# Patient Record
Sex: Male | Born: 1940 | Race: White | Hispanic: No | Marital: Married | Smoking: Former smoker
Health system: Southern US, Community
[De-identification: ages and names within clinical notes are randomized; demographics above are authoritative.]

## PROBLEM LIST (undated history)

## (undated) DIAGNOSIS — Z973 Presence of spectacles and contact lenses: Secondary | ICD-10-CM

## (undated) DIAGNOSIS — C801 Malignant (primary) neoplasm, unspecified: Secondary | ICD-10-CM

## (undated) DIAGNOSIS — R011 Cardiac murmur, unspecified: Secondary | ICD-10-CM

## (undated) DIAGNOSIS — E119 Type 2 diabetes mellitus without complications: Secondary | ICD-10-CM

## (undated) DIAGNOSIS — N390 Urinary tract infection, site not specified: Secondary | ICD-10-CM

## (undated) DIAGNOSIS — G629 Polyneuropathy, unspecified: Secondary | ICD-10-CM

## (undated) DIAGNOSIS — M1990 Unspecified osteoarthritis, unspecified site: Secondary | ICD-10-CM

## (undated) DIAGNOSIS — R413 Other amnesia: Secondary | ICD-10-CM

## (undated) HISTORY — DX: Other amnesia: R41.3

## (undated) HISTORY — PX: COLONOSCOPY: SHX174

## (undated) HISTORY — PX: DENTAL RESTORATION/EXTRACTION WITH X-RAY: SHX5796

## (undated) HISTORY — DX: Urinary tract infection, site not specified: N39.0

## (undated) HISTORY — DX: Malignant (primary) neoplasm, unspecified: C80.1

---

## 1978-06-15 HISTORY — PX: SALIVARY GLAND SURGERY: SHX768

## 1993-06-15 HISTORY — PX: BACK SURGERY: SHX140

## 2010-06-15 HISTORY — PX: KNEE ARTHROSCOPY: SHX127

## 2012-08-30 ENCOUNTER — Other Ambulatory Visit: Payer: Self-pay | Admitting: Orthopedic Surgery

## 2012-08-30 ENCOUNTER — Encounter (HOSPITAL_BASED_OUTPATIENT_CLINIC_OR_DEPARTMENT_OTHER): Payer: Self-pay | Admitting: *Deleted

## 2012-08-30 NOTE — Progress Notes (Signed)
Pt had labs today-seeing pcp in [place] 09/01/12 for ekg and physical and surg clearance Called dr Mosetta [facility]-[phone] to get labs,ekg,notes by 09/02/12 Pt to bring all meds and overnight bag in case he has to stay-lives in [place]

## 2012-09-05 ENCOUNTER — Encounter (HOSPITAL_BASED_OUTPATIENT_CLINIC_OR_DEPARTMENT_OTHER)
Admission: RE | Disposition: A | Discharge status: Home / Self Care | Payer: Self-pay | Source: Ambulatory Visit | Attending: Orthopedic Surgery

## 2012-09-05 ENCOUNTER — Ambulatory Visit (HOSPITAL_BASED_OUTPATIENT_CLINIC_OR_DEPARTMENT_OTHER)
Admission: RE | Admit: 2012-09-05 | Discharge: 2012-09-05 | Disposition: A | Discharge status: Home / Self Care | Payer: Medicare Other | Source: Ambulatory Visit | Attending: Orthopedic Surgery | Admitting: Orthopedic Surgery

## 2012-09-05 ENCOUNTER — Encounter (HOSPITAL_BASED_OUTPATIENT_CLINIC_OR_DEPARTMENT_OTHER): Payer: Self-pay | Admitting: Certified Registered Nurse Anesthetist

## 2012-09-05 ENCOUNTER — Encounter (HOSPITAL_BASED_OUTPATIENT_CLINIC_OR_DEPARTMENT_OTHER): Payer: Self-pay | Admitting: *Deleted

## 2012-09-05 ENCOUNTER — Ambulatory Visit (HOSPITAL_BASED_OUTPATIENT_CLINIC_OR_DEPARTMENT_OTHER): Payer: Medicare Other | Admitting: Certified Registered Nurse Anesthetist

## 2012-09-05 DIAGNOSIS — M67919 Unspecified disorder of synovium and tendon, unspecified shoulder: Secondary | ICD-10-CM | POA: Insufficient documentation

## 2012-09-05 DIAGNOSIS — E1149 Type 2 diabetes mellitus with other diabetic neurological complication: Secondary | ICD-10-CM | POA: Insufficient documentation

## 2012-09-05 DIAGNOSIS — M19019 Primary osteoarthritis, unspecified shoulder: Secondary | ICD-10-CM | POA: Insufficient documentation

## 2012-09-05 DIAGNOSIS — E1142 Type 2 diabetes mellitus with diabetic polyneuropathy: Secondary | ICD-10-CM | POA: Insufficient documentation

## 2012-09-05 DIAGNOSIS — M719 Bursopathy, unspecified: Secondary | ICD-10-CM | POA: Insufficient documentation

## 2012-09-05 HISTORY — DX: Cardiac murmur, unspecified: R01.1

## 2012-09-05 HISTORY — DX: Presence of spectacles and contact lenses: Z97.3

## 2012-09-05 HISTORY — DX: Unspecified osteoarthritis, unspecified site: M19.90

## 2012-09-05 HISTORY — DX: Polyneuropathy, unspecified: G62.9

## 2012-09-05 HISTORY — DX: Type 2 diabetes mellitus without complications: E11.9

## 2012-09-05 HISTORY — PX: SHOULDER OPEN ROTATOR CUFF REPAIR: SHX2407

## 2012-09-05 LAB — POCT HEMOGLOBIN-HEMACUE: Hemoglobin: 14.2 g/dL (ref 13.0–17.0)

## 2012-09-05 LAB — GLUCOSE, CAPILLARY: Glucose-Capillary: 121 mg/dL — ABNORMAL HIGH (ref 70–99)

## 2012-09-05 SURGERY — REPAIR, ROTATOR CUFF, OPEN
Anesthesia: Regional | Site: Shoulder | Laterality: Right | Wound class: Clean

## 2012-09-05 MED ORDER — BUPIVACAINE-EPINEPHRINE 0.5% -1:200000 IJ SOLN
INTRAMUSCULAR | Status: DC | PRN
  Administered 2012-09-05: 20 mL

## 2012-09-05 MED ORDER — LACTATED RINGERS IV SOLN
INTRAVENOUS | Status: DC
  Administered 2012-09-05 (×2): via INTRAVENOUS

## 2012-09-05 MED ORDER — LACTATED RINGERS IR SOLN
Status: DC | PRN
  Administered 2012-09-05: 9000 mL

## 2012-09-05 MED ORDER — GLYCOPYRROLATE 0.2 MG/ML IJ SOLN
INTRAMUSCULAR | Status: DC | PRN
  Administered 2012-09-05: 0.2 mg via INTRAVENOUS

## 2012-09-05 MED ORDER — MIDAZOLAM HCL 2 MG/2ML IJ SOLN
1.0000 mg | INTRAMUSCULAR | Status: DC | PRN
  Administered 2012-09-05: 1 mg via INTRAVENOUS

## 2012-09-05 MED ORDER — ACETAMINOPHEN 10 MG/ML IV SOLN
1000.0000 mg | Freq: Once | INTRAVENOUS | Status: AC
  Administered 2012-09-05: 1000 mg via INTRAVENOUS

## 2012-09-05 MED ORDER — DEXAMETHASONE SODIUM PHOSPHATE 4 MG/ML IJ SOLN
INTRAMUSCULAR | Status: DC | PRN
  Administered 2012-09-05: 4 mg via INTRAVENOUS

## 2012-09-05 MED ORDER — OXYCODONE HCL 5 MG/5ML PO SOLN: 5.0000 mg | Freq: Once | ORAL | Status: DC | PRN

## 2012-09-05 MED ORDER — SUCCINYLCHOLINE CHLORIDE 20 MG/ML IJ SOLN
INTRAMUSCULAR | Status: DC | PRN
  Administered 2012-09-05: 100 mg via INTRAVENOUS

## 2012-09-05 MED ORDER — CEFAZOLIN SODIUM-DEXTROSE 2-3 GM-% IV SOLR
2.0000 g | INTRAVENOUS | Status: AC
  Administered 2012-09-05: 2 g via INTRAVENOUS

## 2012-09-05 MED ORDER — MIDAZOLAM HCL 2 MG/2ML IJ SOLN: 0.5000 mg | Freq: Once | INTRAMUSCULAR | Status: DC | PRN

## 2012-09-05 MED ORDER — PROPOFOL 10 MG/ML IV BOLUS
INTRAVENOUS | Status: DC | PRN
  Administered 2012-09-05: 160 mg via INTRAVENOUS

## 2012-09-05 MED ORDER — OXYCODONE HCL 5 MG PO TABS: 5.0000 mg | ORAL_TABLET | Freq: Once | ORAL | Status: DC | PRN

## 2012-09-05 MED ORDER — PROMETHAZINE HCL 25 MG/ML IJ SOLN: 6.2500 mg | INTRAMUSCULAR | Status: DC | PRN

## 2012-09-05 MED ORDER — BUPIVACAINE-EPINEPHRINE PF 0.5-1:200000 % IJ SOLN
INTRAMUSCULAR | Status: DC | PRN
  Administered 2012-09-05: 30 mL

## 2012-09-05 MED ORDER — MEPERIDINE HCL 25 MG/ML IJ SOLN: 6.2500 mg | INTRAMUSCULAR | Status: DC | PRN

## 2012-09-05 MED ORDER — FENTANYL CITRATE 0.05 MG/ML IJ SOLN
50.0000 ug | INTRAMUSCULAR | Status: DC | PRN
Start: 2012-09-05 — End: 2012-09-05
  Administered 2012-09-05: 50 ug via INTRAVENOUS

## 2012-09-05 MED ORDER — HYDROMORPHONE HCL PF 1 MG/ML IJ SOLN: 0.2500 mg | INTRAMUSCULAR | Status: DC | PRN

## 2012-09-05 MED ORDER — LACTATED RINGERS IV SOLN: INTRAVENOUS | Status: DC

## 2012-09-05 MED ORDER — EPINEPHRINE HCL 1 MG/ML IJ SOLN
INTRAMUSCULAR | Status: DC | PRN
  Administered 2012-09-05: 1 mg

## 2012-09-05 MED ORDER — EPHEDRINE SULFATE 50 MG/ML IJ SOLN
INTRAMUSCULAR | Status: DC | PRN
  Administered 2012-09-05: 10 mg via INTRAVENOUS

## 2012-09-05 SURGICAL SUPPLY — 73 items
ANCHOR PEEK 4.75X19.1 SWLK C (Anchor) ×2 IMPLANT
BENZOIN TINCTURE PRP APPL 2/3 (GAUZE/BANDAGES/DRESSINGS) IMPLANT
BLADE AVERAGE 25X9 (BLADE) IMPLANT
BLADE CUDA 5.5 (BLADE) IMPLANT
BLADE CUDA SHAVER 3.5 (BLADE) IMPLANT
BLADE GREAT WHITE 4.2 (BLADE) ×2 IMPLANT
BLADE SURG 15 STRL LF DISP TIS (BLADE) ×1 IMPLANT
BLADE SURG 15 STRL SS (BLADE) ×1
BUR OVAL 4.0 (BURR) IMPLANT
BUR OVAL 6.0 (BURR) ×2 IMPLANT
BUR VERTEX HOODED 4.5 (BURR) IMPLANT
CANISTER OMNI JUG 16 LITER (MISCELLANEOUS) ×2 IMPLANT
CANISTER SUCTION 2500CC (MISCELLANEOUS) IMPLANT
CANNULA SHOULDER 7CM (CANNULA) IMPLANT
CANNULA TWIST IN 8.25X7CM (CANNULA) ×2 IMPLANT
CHLORAPREP W/TINT 26ML (MISCELLANEOUS) ×2 IMPLANT
CLEANER CAUTERY TIP 5X5 PAD (MISCELLANEOUS) IMPLANT
COVER MAYO STAND STRL (DRAPES) ×2 IMPLANT
DECANTER SPIKE VIAL GLASS SM (MISCELLANEOUS) IMPLANT
DERMABOND ADVANCED (GAUZE/BANDAGES/DRESSINGS)
DERMABOND ADVANCED .7 DNX12 (GAUZE/BANDAGES/DRESSINGS) IMPLANT
DRAPE ARTHROSCOPY W/POUCH 90 (DRAPES) ×2 IMPLANT
DRAPE STERI 35X30 U-POUCH (DRAPES) ×2 IMPLANT
DRAPE SURG 17X23 STRL (DRAPES) ×4 IMPLANT
DRAPE U-SHAPE 47X51 STRL (DRAPES) ×2 IMPLANT
DRAPE U-SHAPE 76X120 STRL (DRAPES) ×4 IMPLANT
DRSG EMULSION OIL 3X3 NADH (GAUZE/BANDAGES/DRESSINGS) ×2 IMPLANT
DRSG TEGADERM 4X4.75 (GAUZE/BANDAGES/DRESSINGS) IMPLANT
ELECT MENISCUS 165MM 90D (ELECTRODE) IMPLANT
ELECT REM PT RETURN 9FT ADLT (ELECTROSURGICAL) ×2
ELECTRODE REM PT RTRN 9FT ADLT (ELECTROSURGICAL) ×1 IMPLANT
GLOVE BIO SURGEON STRL SZ7.5 (GLOVE) ×2 IMPLANT
GLOVE BIOGEL PI IND STRL 8 (GLOVE) ×1 IMPLANT
GLOVE BIOGEL PI INDICATOR 8 (GLOVE) ×1
GLOVE ECLIPSE 6.5 STRL STRAW (GLOVE) ×2 IMPLANT
GOWN PREVENTION PLUS LG XLONG (DISPOSABLE) ×2 IMPLANT
GOWN PREVENTION PLUS XLARGE (GOWN DISPOSABLE) ×4 IMPLANT
NDL SUT 6 .5 CRC .975X.05 MAYO (NEEDLE) IMPLANT
NEEDLE MAYO TAPER (NEEDLE)
NEEDLE SCORPION MULTI FIRE (NEEDLE) ×2 IMPLANT
PACK ARTHROSCOPY DSU (CUSTOM PROCEDURE TRAY) ×2 IMPLANT
PACK BASIN DAY SURGERY FS (CUSTOM PROCEDURE TRAY) ×2 IMPLANT
PAD CLEANER CAUTERY TIP 5X5 (MISCELLANEOUS)
PASSER SUT SWANSON 36MM LOOP (INSTRUMENTS) IMPLANT
PENCIL BUTTON HOLSTER BLD 10FT (ELECTRODE) IMPLANT
SET ARTHROSCOPY TUBING (MISCELLANEOUS) ×1
SET ARTHROSCOPY TUBING LN (MISCELLANEOUS) ×1 IMPLANT
SHEET MEDIUM DRAPE 40X70 STRL (DRAPES) ×2 IMPLANT
SLING ARM FOAM STRAP LRG (SOFTGOODS) IMPLANT
SLING ARM FOAM STRAP MED (SOFTGOODS) IMPLANT
SLING ARM FOAM STRAP SML (SOFTGOODS) IMPLANT
SLING ARM FOAM STRAP XLG (SOFTGOODS) IMPLANT
SLING ULTRA II LARGE (SOFTGOODS) ×2 IMPLANT
SPONGE GAUZE 4X4 12PLY (GAUZE/BANDAGES/DRESSINGS) ×2 IMPLANT
SPONGE LAP 4X18 X RAY DECT (DISPOSABLE) IMPLANT
STRIP CLOSURE SKIN 1/2X4 (GAUZE/BANDAGES/DRESSINGS) IMPLANT
SUCTION FRAZIER TIP 10 FR DISP (SUCTIONS) IMPLANT
SUT ETHIBOND 2 OS 4 DA (SUTURE) IMPLANT
SUT ETHILON 3 0 PS 1 (SUTURE) IMPLANT
SUT FIBERWIRE #2 38 T-5 BLUE (SUTURE) ×4
SUT TIGER TAPE 7 IN WHITE (SUTURE) ×2 IMPLANT
SUT VIC AB 0 SH 27 (SUTURE) IMPLANT
SUT VIC AB 2-0 CT3 27 (SUTURE) IMPLANT
SUT VIC AB 2-0 SH 27 (SUTURE)
SUT VIC AB 2-0 SH 27XBRD (SUTURE) IMPLANT
SUT VICRYL 4-0 PS2 18IN ABS (SUTURE) IMPLANT
SUT VICRYL RAPIDE 4/0 PS 2 (SUTURE) IMPLANT
SUTURE FIBERWR #2 38 T-5 BLUE (SUTURE) ×2 IMPLANT
SYR BULB 3OZ (MISCELLANEOUS) IMPLANT
TOWEL OR 17X24 6PK STRL BLUE (TOWEL DISPOSABLE) ×2 IMPLANT
TOWEL OR NON WOVEN STRL DISP B (DISPOSABLE) ×2 IMPLANT
WAND STAR VAC 90 (SURGICAL WAND) ×2 IMPLANT
YANKAUER SUCT BULB TIP NO VENT (SUCTIONS) IMPLANT

## 2012-09-05 NOTE — H&P (Signed)
Joseph Lawson is an 72 y.o. male.   CC / Reason for Visit: Right shoulder pain HPI: This patient returns for reevaluation having undergone a right sided subacromial injection on 04/04/12 and 06-10-12.  Both times he had full resolution of his symptoms, and most recently his symptoms recurred when he was required to do some fairly heavy physical labor at [facility].  He would like the problem definitively treated as fishing season is upcoming.  He confirms that his symptoms remain much the same as last visit and MRI scan has been performed in the interim.  It reveals a.c. joint arthritis, supraspinatus tear with mild retraction, and intrasubstance tearing of the biceps Presenting history follows:  This patient is a 72 year old male who presents for evaluation of right shoulder pain.  He reports a 20-25 years ago he had a similar bout of pain that resolved with some type of an injection.  He was told at that time that he had bone spurs.  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  It is aching, causes some weakness, worsened with activity.  Past Medical History  Diagnosis Date  . Heart murmur   . Diabetes mellitus without complication   . Arthritis   . Wears glasses   . 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 Back surgery  1995    lumb lam  . Dental restoration/extraction with x-ray    . Knee arthroscopy  2012    right  . Colonoscopy    . Salivary gland surgery  1980    right neck    History reviewed. No pertinent family history. Social History:  reports that he quit smoking about 20 years ago. He does not have any smokeless tobacco history on file. He reports that  drinks alcohol. He reports that he does not use illicit drugs.  Allergies: No Known Allergies  No prescriptions prior to admission    No results found for this or any previous visit (from  the past 48 hour(s)). No results found.  Review of Systems  All other systems reviewed and are negative.    Height 6\' 3"  (1.905 m), weight 108.863 kg (240 lb). Physical Exam   Constitutional:  WD, WN, NAD HEENT:  NCAT, EOMI Neuro/Psych:  Alert & oriented to person, place, and time; appropriate mood & affect Lymphatic: No generalized UE edema or lymphadenopathy Extremities / MSK:  Both UE are normal with respect to appearance, ranges of motion, joint stability, muscle strength/tone, sensation, & perfusion except as otherwise noted:  Right shoulder has good range of motion and strength, some pain with impingement maneuvers but is mostly tender along the long head of the biceps tendon over the proximal humerus.  Increased pain with resisted shoulder forward flexion and slight pain with resisted supination with the shoulder abducted.  Good rotator cuff strength and good overall shoulder motion.  Mild pain with firm palpation down upon the a.c. joint, coupled with crossed chest adduction  Assessment: Right shoulder rotator cuff tear, a.c. joint arthritis, bicipital tendinosis and subacromial bursitis  Plan:  Today's findings were discussed with the patient.  We reviewed an operative plan and includes arthroscopy for diagnostic purposes, followed by distal clavicle excision, subacromial decompression, and likely biceps tenotomy.  The rotator cuff will be repaired if thought to be reparable.    The details of  the operative procedure were discussed with the patient.  Questions were invited and answered.  In addition to the goal of the procedure, the risks of the procedure to include but not limited to bleeding; infection; damage to the nerves or blood vessels that could result in bleeding, numbness, weakness, chronic pain, and the need for additional procedures; stiffness; the need for revision surgery; and anesthetic risks, the worst of which is death, were reviewed.  No specific outcome was  guaranteed or implied.  Informed consent was obtained.  Prescriptions for postoperative analgesia were also written.  A return appointment 10-15 days postop was made.  Sheralee Qazi A. 09/05/2012, 12:20 AM

## 2012-09-05 NOTE — Anesthesia Procedure Notes (Addendum)
Anesthesia Regional Block:  Interscalene brachial plexus block  Pre-Anesthetic Checklist: ,, timeout performed, Correct Patient, Correct Site, Correct Laterality, Correct Procedure, Correct Position, site marked, Risks and benefits discussed,  Surgical consent,  Pre-op evaluation,  At surgeon's request and post-op pain management  Laterality: Right  Prep: chloraprep       Needles:  Injection technique: Single-shot  Needle Type: Stimulator Needle - 40     Needle Length: 4cm  Needle Gauge: 22 and 22 G    Additional Needles:  Procedures: nerve stimulator Interscalene brachial plexus block  Nerve Stimulator or Paresthesia:  Response: 0.45, 0.1 mA,   Additional Responses:   Narrative:  Start time: 09/05/2012 8:16 AM End time: 09/05/2012 8:20 AM Injection made incrementally with aspirations every 5 mL.  Performed by: Personally  Anesthesiologist: Sandford Craze, MD  Additional Notes: Pt identified in Holding room.  Monitors applied. Working IV access confirmed. Sterile prep R neck.  #22ga PNS to forearm twitch at 0.21mA threshold.  30cc 0.5% Bupivacaine with 1:200k epi injected incrementally after negative test dose.  Patient asymptomatic, VSS, no heme aspirated, tolerated well.  Sandford Craze, MD   Procedure Name: Intubation Date/Time: 09/05/2012 8:58 AM Performed by: Aleria Maheu D Pre-anesthesia Checklist: Patient identified, Emergency Drugs available, Suction available and Patient being monitored Patient Re-evaluated:Patient Re-evaluated prior to inductionOxygen Delivery Method: Circle System Utilized Preoxygenation: Pre-oxygenation with 100% oxygen Intubation Type: IV induction Ventilation: Mask ventilation without difficulty Laryngoscope Size: Mac and 3 Grade View: Grade III Tube type: Oral Tube size: 7.0 mm Number of attempts: 1 Airway Equipment and Method: stylet Placement Confirmation: ETT inserted through vocal cords under direct vision,  positive ETCO2 and breath  sounds checked- equal and bilateral Secured at: 21 cm Tube secured with: Tape Dental Injury: Teeth and Oropharynx as per pre-operative assessment

## 2012-09-05 NOTE — Progress Notes (Signed)
Assisted Dr. Jackson with right, interscalene  block. Side rails up, monitors on throughout procedure. See vital signs in flow sheet. Tolerated Procedure well. 

## 2012-09-05 NOTE — Anesthesia Postprocedure Evaluation (Signed)
  Anesthesia Post-op Note  Patient: Joseph Lawson  Procedure(s) Performed: Procedure(s): RIGHT SHOULDER ROTATOR CUFF REPAIR WITH DISTAL CLAVICLE EXCISION/BICEPS TENOTOMY (Right)  Patient Location: PACU  Anesthesia Type:GA combined with regional for post-op pain  Level of Consciousness: awake, alert , oriented and patient cooperative  Airway and Oxygen Therapy: Patient Spontanous Breathing  Post-op Pain: none  Post-op Assessment: Post-op Vital signs reviewed, Patient's Cardiovascular Status Stable, Respiratory Function Stable, Patent Airway, No signs of Nausea or vomiting and Pain level controlled  Post-op Vital Signs: Reviewed and stable  Complications: No apparent anesthesia complications

## 2012-09-05 NOTE — Transfer of Care (Signed)
Immediate Anesthesia Transfer of Care Note  Patient: Joseph Lawson  Procedure(s) Performed: Procedure(s): RIGHT SHOULDER ROTATOR CUFF REPAIR WITH DISTAL CLAVICLE EXCISION/BICEPS TENOTOMY (Right)  Patient Location: PACU  Anesthesia Type:GA combined with regional for post-op pain  Level of Consciousness: awake, alert , oriented and patient cooperative  Airway & Oxygen Therapy: Patient Spontanous Breathing and Patient connected to face mask oxygen  Post-op Assessment: Report given to PACU RN and Post -op Vital signs reviewed and stable  Post vital signs: Reviewed and stable  Complications: No apparent anesthesia complications

## 2012-09-05 NOTE — Interval H&P Note (Signed)
History and Physical Interval Note:  09/05/2012 8:04 AM  Joseph Lawson  has presented today for surgery, with the diagnosis of Right shoudler rotator cuff tear, AC Joint Arthritis, Biceps tendosis  The various methods of treatment have been discussed with the patient and family. After consideration of risks, benefits and other options for treatment, the patient has consented to  Procedure(s): RIGHT SHOULDER ROTATOR CUFF REPAIR WITH DISTAL CLAVICLE EXCISION/BICEPS TENOTOMY (Right) as a surgical intervention .  The patient's history has been reviewed, patient examined, no change in status, stable for surgery.  I have reviewed the patient's chart and labs.  Questions were answered to the patient's satisfaction.     [name]

## 2012-09-05 NOTE — Anesthesia Preprocedure Evaluation (Signed)
Anesthesia Evaluation  Patient identified by MRN, date of birth, ID band Patient awake    Reviewed: Allergy & Precautions, H&P , NPO status , Patient's Chart, lab work & pertinent test results  History of Anesthesia Complications Negative for: history of anesthetic complications  Airway Mallampati: II TM Distance: >3 FB Neck ROM: Full    Dental  (+) Teeth Intact and Dental Advisory Given   Pulmonary Current Smoker,  breath sounds clear to auscultation  Pulmonary exam normal       Cardiovascular negative cardio ROS  Rhythm:Regular Rate:Normal  '07 ECHO: normal LVF, EF 65-70%, valves OK   Neuro/Psych negative neurological ROS  negative psych ROS   GI/Hepatic negative GI ROS, Neg liver ROS,   Endo/Other  diabetes, Well Controlled, Type 2, Oral Hypoglycemic Agents  Renal/GU negative Renal ROS     Musculoskeletal   Abdominal   Peds  Hematology   Anesthesia Other Findings   Reproductive/Obstetrics                           Anesthesia Physical Anesthesia Plan  ASA: II  Anesthesia Plan: General   Post-op Pain Management:    Induction: Intravenous  Airway Management Planned: Oral ETT  Additional Equipment:   Intra-op Plan:   Post-operative Plan: Extubation in OR  Informed Consent: I have reviewed the patients History and Physical, chart, labs and discussed the procedure including the risks, benefits and alternatives for the proposed anesthesia with the patient or authorized representative who has indicated his/her understanding and acceptance.   Dental advisory given  Plan Discussed with: Surgeon and CRNA  Anesthesia Plan Comments: (Plan routine monitors, GETA with interscalene block for post op analgesia)        Anesthesia Quick Evaluation

## 2012-09-05 NOTE — Op Note (Signed)
09/05/2012  8:42 AM  PATIENT:  Joseph Lawson  72 y.o. male  PRE-OPERATIVE DIAGNOSIS:  Right shoulder rotator cuff tear, subacromial bursitis, a.c. joint arthritis and biceps tendinosis  POST-OPERATIVE DIAGNOSIS:  Same  PROCEDURE:  Right shoulder arthroscopy with biceps tenotomy, rotator cuff repair, subacromial decompression with acromioplasty and distal clavicle excision  SURGEON: Cliffton Asters. Janee Morn, MD  PHYSICIAN ASSISTANT: None  ANESTHESIA:  regional and general  SPECIMENS:  None  DRAINS:   None  PREOPERATIVE INDICATIONS:  [name] is a  72 y.o. male with a diagnosis of right shoulder pain and diagnoses listed above who failed conservative measures and elected for surgical management.    The risks benefits and alternatives were discussed with the patient preoperatively including but not limited to the risks of infection, bleeding, nerve injury, cardiopulmonary complications, the need for revision surgery, among others, and the patient verbalized understanding and consented to proceed.  OPERATIVE IMPLANTS: FiberWire sutures x2, fiber tape secured in 4.75 Arthrex Swivel-lock anchor  OPERATIVE FINDINGS: Biceps tendinosis with fraying and longitudinal split tearing at the transverse humeral ligament. Medium L-shaped rotator cuff tear closed with 2 convergence sutures and a fiber tape in a swivel lock anchor  OPERATIVE PROCEDURE:  After receiving prophylactic antibiotics and a regional anesthetic block, the patient was escorted to the operative theatre and placed in a supine position.  General anesthesia was administered and he was repositioned in a beachchair positioner with care to ensure good neck alignment. A surgical "time-out" was performed during which the planned procedure, proposed operative site, and the correct patient identity were compared to the operative consent and agreement confirmed by the circulating nurse according to current facility policy.  Following application of a  tourniquet to the operative extremity, the exposed skin was prepped with DuraPrep and draped in the usual sterile fashion.   The landmarks were drawn and the anticipated portals anesthetized with half percent Marcaine with epinephrine. The posterior standard viewing portal was established first, followed by establishment of an anterior working portal using needle localization.. Intra-articularly, the glenohumeral articular cartilage was in good condition. There was some minor degenerative change and minor inferior osteophyte of the humerus. The subscapularis appear unremarkable. The labrum was good. The biceps however had fraying and split tearing at the level of the transverse humeral ligament. It was more pronounced as some of the biceps was pulled from the groove into the joint for viewing. Decision was made to proceed with  biceps tenotomy and this was done with basket forceps, ArthroCare wand, and suction shaver. Once satisfied with this, the rotator cuff tear was identified and inspected. Arthroscopy was then moved to be subacromial. An anterolateral portal was established and subacromial decompression performed with bursal resection and acromioplasty with a rotating bur. The cuff was further identified, debrided, and the native area footprint prepared using a shaver and light bur. The previous anterior portal was then repositioned into the subacromial space and a second lateral portal, slightly posterior to the first was established.  Using the scorpion device a FiberWire was passed deep in the tear and the tails brought out anteriorly. A fiber tape was then passed across the tear with one limb on each side of the cuff tear so that he can be secured in swivel lock, also providing an element of convergence. Decision was made to place 1 more FiberWire convergence suture between the other 2 that had been placed. Once this was done, the most posterior was tied first with standard arthroscopic knot followed by  the middle one and lastly the most lateral fiber tape was then secured with a 4.75 mm swivel lock anchor. The repair was felt to be good, having reasonably reapproximated the retracted L-shaped component of the tear. It was not excessively tight. The cuff had reasonably good mobility which allowed for a repair without excessive tension. Once satisfied with this, the acromioplasty was refined just a little bit and then a standard distal clavicle excision performed arthroscopically. This required establishment of a separate anterior portal due to the exact obliquity of the a.c. joint. Satisfied with the distal clavicle excision, the arthroscopic instruments were removed and the portals were closed with staples. The wound was dressed and the arm placed in a abduction pillow sling. He was awakened and taken to recovery room in stable condition, breathing spontaneously  DISPOSITION: He will be discharged home today with typical postop instructions returning in 10-15 days for reassessment.  We will arrange for him to begin PT with Carlton Adam in [place] next week.

## 2012-09-06 ENCOUNTER — Encounter (HOSPITAL_BASED_OUTPATIENT_CLINIC_OR_DEPARTMENT_OTHER): Payer: Self-pay | Admitting: Orthopedic Surgery

## 2017-03-31 ENCOUNTER — Ambulatory Visit: Payer: Medicare HMO | Admitting: Neurology

## 2017-04-01 ENCOUNTER — Encounter: Payer: Self-pay | Admitting: Neurology

## 2017-04-01 ENCOUNTER — Ambulatory Visit (INDEPENDENT_AMBULATORY_CARE_PROVIDER_SITE_OTHER): Payer: Medicare HMO | Admitting: Neurology

## 2017-04-01 VITALS — BP 138/78 | HR 69 | Resp 18 | Ht 74.0 in | Wt 251.0 lb

## 2017-04-01 DIAGNOSIS — R413 Other amnesia: Secondary | ICD-10-CM

## 2017-04-01 DIAGNOSIS — E538 Deficiency of other specified B group vitamins: Secondary | ICD-10-CM

## 2017-04-01 DIAGNOSIS — G479 Sleep disorder, unspecified: Secondary | ICD-10-CM

## 2017-04-01 HISTORY — DX: Other amnesia: R41.3

## 2017-04-01 MED ORDER — DONEPEZIL HCL 5 MG PO TABS: 5.0000 mg | ORAL_TABLET | Freq: Every day | ORAL | 1 refills | Status: DC

## 2017-04-01 NOTE — Patient Instructions (Signed)
   We will get MRI of the brain and get a sleep evaluation.  We will start Aricept for the memory.  Begin Aricept (donepezil) at 5 mg at night for one month. If this medication is well-tolerated, please call our office and we will call in a prescription for the 10 mg tablets. Look out for side effects that may include nausea, diarrhea, weight loss, or stomach cramps. This medication will also cause a runny nose, therefore there is no need for allergy medications for this purpose.

## 2017-04-01 NOTE — Progress Notes (Signed)
Reason for visit: Memory disturbance  Referring physician: Dr. Timmie Foerster Lawson is a 76 y.o. male  History of present illness:  Joseph Lawson is a 76 year old right-handed white male with a history of a slowly progressive memory disorder that began about 12 months ago. Joseph Lawson comes in with his son and wife. Joseph Lawson has noted some problems with remembering names for people but this has been a lifelong problem for him. He does have some mild short-term memory issues, he denies any problems with word finding. He operates a Teacher, music, but he does have some troubles with directions, he reports no safety issues. He does not do Joseph finances, his wife has always done this. He is able to keep up with some medications and he has some difficulty keeping up with appointments. Joseph Lawson currently indicates that he sleeps fairly well at night, but his wife has clearly noted episodes of sleep apnea in Joseph past. She claims that his snoring has improved over Joseph last 4 or 5 years, but his energy level has dramatically changed. Over Joseph last 4 or 5 months he has had a significant change in his overall activity level with a drop-off in his ability to do things during Joseph day. If he is inactive, he will tend to drop off to sleep. Joseph Lawson believes that he sleeps fairly well at night. He denies that he feels fatigued during Joseph day but his wife contradicts this. Joseph Lawson reports no numbness or weakness of Joseph face, arms, or legs. Joseph Lawson has no changes in balance or difficulty controlling Joseph bowels or Joseph bladder. Joseph Lawson claims that his maternal and paternal grandfathers had dementia, his mother, several aunts and uncles also have Alzheimer's disease. Joseph Lawson is sent to this office for further evaluation.  Past Medical History:  Diagnosis Date  . Arthritis   . Cancer ([place])   . Diabetes mellitus without complication ([place])   . Heart murmur   . Neuropathy    from back surgery and  diabetes  . Wears glasses     Past Surgical History:  Procedure Laterality Date  . BACK SURGERY  1995   lumb lam  . COLONOSCOPY    . DENTAL RESTORATION/EXTRACTION WITH X-RAY    . KNEE ARTHROSCOPY  2012   right  . SALIVARY GLAND SURGERY  1980   right neck  . SHOULDER OPEN ROTATOR CUFF REPAIR Right 09/05/2012   Procedure: RIGHT SHOULDER ROTATOR CUFF REPAIR WITH DISTAL CLAVICLE EXCISION/BICEPS TENOTOMY;  Surgeon: Jolyn Nap, MD;  Location: [facility];  Service: Orthopedics;  Laterality: Right;    Family History  Problem Relation Age of Onset  . Dementia Mother   . Alzheimer's disease Mother   . Dementia Father     Social history:  reports that he quit smoking about 24 years ago. He has quit using smokeless tobacco. He reports that he drinks alcohol. He reports that he does not use drugs.  Medications:  Prior to Admission medications   Medication Sig Start Date End Date Taking? Authorizing Provider  aspirin 81 MG tablet Take 81 mg by mouth daily.   Yes [provider]  HYDROcodone-acetaminophen (NORCO/VICODIN) 5-325 MG tablet  01/28/17  Yes [provider]  lisinopril (PRINIVIL,ZESTRIL) 5 MG tablet TAKE 1 TABLET BY MOUTH EVERY DAY 10/14/16  Yes [provider]  metFORMIN (GLUCOPHAGE) 1000 MG tablet  02/10/17  Yes [provider]  pioglitazone (ACTOS) 15 MG tablet  01/21/17  Yes [provider]  terbinafine (LAMISIL) 250 MG tablet  03/26/17  Yes [provider]  gabapentin (NEURONTIN) 100 MG capsule Take 300 mg by mouth 2 (two) times daily.    [provider]  pioglitazone-metformin (ACTOPLUS MET) 15-500 MG per tablet Take 2 tablets by mouth 2 (two) times daily with a meal. Takes 2 tabs 2x daily    [provider]     No Known Allergies  ROS:  Out of a complete 14 system review of symptoms, Joseph Lawson complains only of Joseph following symptoms, and all other reviewed systems are  negative.  Fatigue Heart murmur Ringing in Joseph ears Moles Impotence Confusion  Blood pressure 138/78, pulse 69, resp. rate 18, height 6' 2"  (1.88 m), weight 251 lb (113.9 kg).  Physical Exam  General: Joseph Lawson is alert and cooperative at Joseph time of Joseph examination. Joseph Lawson is moderately obese.  Eyes: Pupils are equal, round, and reactive to light. Discs are flat bilaterally.  Neck: Joseph neck is supple, no carotid bruits are noted.  Respiratory: Joseph respiratory examination is clear.  Cardiovascular: Joseph cardiovascular examination reveals a regular rate and rhythm, no obvious murmurs or rubs are noted.  Skin: Extremities are without significant edema.  Neurologic Exam  Mental status: Joseph Lawson is alert and oriented x 3 at Joseph time of Joseph examination. Joseph Lawson has apparent normal recent and remote memory, with an apparently normal attention span and concentration ability. Mini-Mental Status Examination done today shows a total score of 30/30.  Cranial nerves: Facial symmetry is present. There is good sensation of Joseph face to pinprick and soft touch bilaterally. Joseph strength of Joseph facial muscles and Joseph muscles to head turning and shoulder shrug are normal bilaterally. Speech is well enunciated, no aphasia or dysarthria is noted. Extraocular movements are full. Visual fields are full. Joseph tongue is midline, and Joseph Lawson has symmetric elevation of Joseph soft palate. No obvious hearing deficits are noted.  Motor: Joseph motor testing reveals 5 over 5 strength of all 4 extremities. Good symmetric motor tone is noted throughout.  Sensory: Sensory testing is intact to pinprick, soft touch, vibration sensation, and position sense on all 4 extremities. No evidence of extinction is noted.  Coordination: Cerebellar testing reveals good finger-nose-finger and heel-to-shin bilaterally.  Gait and station: Gait is normal. Tandem gait is normal. Romberg is negative. No drift is  seen.  Reflexes: Deep tendon reflexes are symmetric and normal bilaterally. Toes are downgoing bilaterally.   Assessment/Plan:  1. Memory disturbance  2. Fatigue, observed sleep apnea  Joseph Lawson will be sent for blood work today. He will have MRI of Joseph brain done. Given Joseph history of observed sleep apnea and increased fatigue recently, Joseph Lawson will be sent for a sleep evaluation. Joseph Lawson will be placed on Aricept beginning at 5 mg at night for one month, we will increase to 10 mg if he is tolerating Joseph drug. He will follow-up in 6 months.  Jill Alexanders MD 04/01/2017 10:36 AM  [facility] [address] [place], [place] [address]  Phone [phone] Fax [phone]

## 2017-04-02 ENCOUNTER — Telehealth: Payer: Self-pay | Admitting: *Deleted

## 2017-04-02 LAB — SEDIMENTATION RATE: Sed Rate: 2 mm/hr (ref 0–30)

## 2017-04-02 LAB — RPR: RPR Ser Ql: NONREACTIVE

## 2017-04-02 LAB — VITAMIN B12: VITAMIN B 12: 350 pg/mL (ref 232–1245)

## 2017-04-02 NOTE — Telephone Encounter (Signed)
Patient returned call and I made him aware of his lab results.

## 2017-04-02 NOTE — Telephone Encounter (Signed)
-----   Message from Kathrynn Ducking, MD sent at 04/02/2017  7:45 AM EDT -----  The blood work results are unremarkable. Please call the patient.  ----- Message ----- From: Lavone Neri Lab Results In Sent: 04/02/2017   7:42 AM To: Kathrynn Ducking, MD

## 2017-04-02 NOTE — Telephone Encounter (Signed)
Called and LVM for pt to call about results.  Okay to inform patient labs unremarkable per CW,MD if he calls.

## 2017-04-11 ENCOUNTER — Ambulatory Visit
Admission: RE | Admit: 2017-04-11 | Discharge: 2017-04-11 | Disposition: A | Discharge status: Home / Self Care | Payer: Medicare HMO | Source: Ambulatory Visit | Attending: Neurology | Admitting: Neurology

## 2017-04-11 DIAGNOSIS — R413 Other amnesia: Secondary | ICD-10-CM

## 2017-04-13 ENCOUNTER — Telehealth: Payer: Self-pay | Admitting: Neurology

## 2017-04-13 NOTE — Telephone Encounter (Signed)
  I called the patient.  MRI of the brain shows diffuse atrophy, minimal white matter changes.  The patient is getting on Aricept, he will be getting a sleep evaluation.  MRI brain 04/12/17:  IMPRESSION:  This MRI of the brain without contrast shows the following: 1.    Moderate generalized cortical atrophy. 2.    Minimal age-appropriate chronic microvascular ischemic change. 3.    There are no acute findings.

## 2017-05-04 ENCOUNTER — Encounter: Payer: Self-pay | Admitting: Neurology

## 2017-05-04 ENCOUNTER — Ambulatory Visit (INDEPENDENT_AMBULATORY_CARE_PROVIDER_SITE_OTHER): Payer: Medicare HMO | Admitting: Neurology

## 2017-05-04 VITALS — BP 153/71 | HR 70 | Ht 75.0 in | Wt 252.0 lb

## 2017-05-04 DIAGNOSIS — R413 Other amnesia: Secondary | ICD-10-CM

## 2017-05-04 DIAGNOSIS — R400 Somnolence: Secondary | ICD-10-CM | POA: Diagnosis not present

## 2017-05-04 DIAGNOSIS — R0683 Snoring: Secondary | ICD-10-CM | POA: Diagnosis not present

## 2017-05-04 NOTE — Progress Notes (Signed)
Subjective:    Patient ID: Joseph Lawson is a 76 y.o. male.  HPI     Joseph Age, MD, PhD [facility] [address], [address] [place], [place] [address]  Dear Joseph Lawson,   I saw your patient, Joseph Lawson, upon your kind request in my clinic today for initial consultation of his sleep disorder, in particular, concern for underlying obstructive sleep apnea. The patient is unaccompanied today. As you know, Joseph Lawson is a 76 year old right-handed gentleman with an underlying medical history of memory loss, arthritis, diabetes, neuropathy, status post back surgery and obesity, who reports some sleep difficulties. His wife had voiced concern about his daytime somnolence. He used to snore in the past as he recalls. His Epworth sleepiness score is 7 out of 24 today, fatigue score is 29 out of 63. He is married and lives with his wife. He has 2 children. 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 His brother died at 59 from heart d/s. He has a FHx of Heart d/s. He quit smoking about 20 years ago. He does chew tobacco, drinks alcohol occasionally, does not drink caffeine, only decaf coffee or soda. No night to night nocturia, no AM HAs. He has no RLS symptoms, tolerating the Aricept. He walks and likes to work in the yard. He goes to bed around MN, WT 7:30 or 8. He has 7 GC and 1 GGC.   His Past Medical History Is Significant For: Past Medical History:  Diagnosis Date  . Arthritis   . Cancer ([facility])   . Diabetes mellitus without complication ([place])   . Heart murmur   . Memory disorder 04/01/2017  . 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 Wears glasses     His Past Surgical History Is Significant For: Past Surgical History:  Procedure Laterality Date  . BACK SURGERY  1995   lumb lam  . COLONOSCOPY    . DENTAL RESTORATION/EXTRACTION WITH X-RAY    . KNEE ARTHROSCOPY  2012   right  . SALIVARY GLAND SURGERY  1980   right neck   . SHOULDER OPEN ROTATOR CUFF REPAIR Right 09/05/2012   Procedure: RIGHT SHOULDER ROTATOR CUFF REPAIR WITH DISTAL CLAVICLE EXCISION/BICEPS TENOTOMY;  Surgeon: Jolyn Nap, MD;  Location: [facility];  Service: Orthopedics;  Laterality: Right;    His Family History Is Significant For: Family History  Problem Relation Lawson of Onset  . Dementia Mother   . Alzheimer's disease Mother   . Dementia Father     His Social History Is Significant For: Social History   Socioeconomic History  . Marital status: Married    Spouse name: None  . Number of children: None  . Years of education: None  . Highest education level: None  Social Needs  . Financial resource strain: None  . Food insecurity - worry: None  . Food insecurity - inability: None  . Transportation needs - medical: None  . Transportation needs - non-medical: None  Occupational History  . None  Tobacco Use  . Smoking status: Former Smoker    Last attempt to quit: 08/30/1992    Years since quitting: 24.6  . Smokeless tobacco: Former Network engineer and Sexual Activity  . Alcohol use: Yes    Comment: occ  . Drug use: No  . Sexual activity: None    Comment: still smokes occ cigar  Other Topics Concern  . None  Social History  Narrative  . None    His Allergies Are:  No Known Allergies:   His Current Medications Are:  Outpatient Encounter Medications as of 05/04/2017  Medication Sig  . aspirin 81 MG tablet Take 81 mg by mouth daily.  Marland Kitchen donepezil (ARICEPT) 5 MG tablet Take 1 tablet (5 mg total) by mouth at bedtime.  . gabapentin (NEURONTIN) 100 MG capsule Take 300 mg by mouth 2 (two) times daily.  Marland Kitchen HYDROcodone-acetaminophen (NORCO/VICODIN) 5-325 MG tablet   . lisinopril (PRINIVIL,ZESTRIL) 5 MG tablet TAKE 1 TABLET BY MOUTH EVERY DAY  . metFORMIN (GLUCOPHAGE) 1000 MG tablet   . pioglitazone (ACTOS) 15 MG tablet   . pioglitazone-metformin (ACTOPLUS MET) 15-500 MG per tablet Take 2 tablets by mouth 2  (two) times daily with a meal. Takes 2 tabs 2x daily  . terbinafine (LAMISIL) 250 MG tablet    No facility-administered encounter medications on file as of 05/04/2017.   :  Review of Systems:  Out of a complete 14 point review of systems, all are reviewed and negative with the exception of these symptoms as listed below:  Review of Systems  Neurological:       Pt presents today to discuss his sleep. Pt states that he is unsure of why his wife is worried about his sleep. Pt has never had a sleep study and does not endorse snoring nor apnea.  Epworth Sleepiness Scale 0= would never doze 1= slight chance of dozing 2= moderate chance of dozing 3= high chance of dozing  Sitting and reading: 1 Watching TV: 2 Sitting inactive in a public place (ex. Theater or meeting): 1 As a passenger in a car for an hour without a break: 1 Lying down to rest in the afternoon: 1 Sitting and talking to someone: 0 Sitting quietly after lunch (no alcohol): 1 In a car, while stopped in traffic: 0 Total: 7     Objective:  Neurological Exam  Physical Exam Physical Examination:   Vitals:   05/04/17 1621  BP: (!) 153/71  Pulse: 70   General Examination: The patient is a very pleasant 76 y.o. male in no acute distress. He appears well-developed and well-nourished and well groomed.   HEENT: Normocephalic, atraumatic, pupils are equal, round and reactive to light and accommodation. Funduscopic exam is normal with sharp disc margins noted. Extraocular tracking is good without limitation to gaze excursion or nystagmus noted. Normal smooth pursuit is noted. Hearing is grossly intact. Tympanic membranes are clear bilaterally. Face is symmetric with normal facial animation and normal facial sensation. Speech is clear with no dysarthria noted. There is no hypophonia. There is no lip, neck/head, jaw or voice tremor. Neck is supple with full range of passive and active motion. There are no carotid bruits on  auscultation. Oropharynx exam reveals: mild mouth dryness, adequate dental hygiene and mild airway crowding, due to smaller airway entry, longer uvula. Tonsils are in place but small, only visible on the left. Mallampati is class II. Tongue protrudes centrally and palate elevates symmetrically. Neck size is 16  7/8 inches.   Chest: Clear to auscultation without wheezing, rhonchi or crackles noted.  Heart: S1+S2+0, regular and normal without murmurs, rubs or gallops noted.   Abdomen: Soft, non-tender and non-distended with normal bowel sounds appreciated on auscultation.  Extremities: There is no pitting edema in the distal lower extremities bilaterally. Pedal pulses are intact.  Skin: Warm and dry without trophic changes noted.  Musculoskeletal: exam reveals no obvious joint deformities, tenderness or joint  swelling or erythema.   Neurologically:  Mental status: The patient is awake, alert and oriented in all 4 spheres. His immediate and remote memory, attention, language skills and fund of knowledge are appropriate. There is no evidence of aphasia, agnosia, apraxia or anomia. Speech is clear with normal prosody and enunciation. Thought process is linear. Mood is normal and affect is normal.  Cranial nerves II - XII are as described above under HEENT exam. In addition: shoulder shrug is normal with equal shoulder height noted. Motor exam: Normal bulk, strength and tone is noted. There is no drift, tremor or rebound. Reflexes are 1+ throughout. Fine motor skills and coordination: intact with normal finger taps, normal hand movements, normal rapid alternating patting, normal foot taps and normal foot agility.  Cerebellar testing: No dysmetria or intention tremor. There is no truncal or gait ataxia.  Sensory exam: intact to light touch in the upper and lower extremities.  Gait, station and balance: He stands easily. No veering to one side is noted. No leaning to one side is noted. Posture is  Lawson-appropriate and stance is narrow based. Gait shows normal stride length and normal pace. No problems turning are noted.            Assessment and Plan:  In summary, Reford Olliff is a very pleasant 76 y.o.-year old male with an underlying medical history of memory loss, arthritis, diabetes, neuropathy, status post back surgery and obesity, whose history and physical exam are somewhat concerning for obstructive sleep apnea (OSA). In light of his memory loss, sleep study testing is certainly feasible.  I had a long chat with the patient about my findings and the diagnosis of OSA, its prognosis and treatment options. We talked about medical treatments, surgical interventions and non-pharmacological approaches. I explained in particular the risks and ramifications of untreated moderate to severe OSA, especially with respect to developing cardiovascular disease down the Road, including congestive heart failure, difficult to treat hypertension, cardiac arrhythmias, or stroke. Even type 2 diabetes has, in part, been linked to untreated OSA. Symptoms of untreated OSA include daytime sleepiness, memory problems, mood irritability and mood disorder such as depression and anxiety, lack of energy, as well as recurrent headaches, especially morning headaches. We talked about trying to maintain a healthy lifestyle in general, as well as the importance of weight control. I encouraged the patient to eat healthy, exercise daily and keep well hydrated, to keep a scheduled bedtime and wake time routine, to not skip any meals and eat healthy snacks in between meals. I advised the patient not to drive when feeling sleepy. I recommended the following at this time: sleep study with potential positive airway pressure titration. (We will score hypopneas at 4%).   I explained the sleep test procedure to the patient and also outlined possible surgical and non-surgical treatment options of OSA, including the use of a custom-made dental  device (which would require a referral to a specialist dentist or oral surgeon), upper airway surgical options, such as pillar implants, radiofrequency surgery, tongue base surgery, and UPPP (which would involve a referral to an ENT surgeon). Rarely, jaw surgery such as mandibular advancement may be considered.  I also explained the CPAP treatment option to the patient, who indicated that he would be willing to try CPAP if the need arises. I explained the importance of being compliant with PAP treatment, not only for insurance purposes but primarily to improve His symptoms, and for the patient's long term health benefit, including to reduce  His cardiovascular risks. I answered all his questions today and the patient was in agreement. I will likely see him back after the sleep study is completed and encouraged him to call with any interim questions, concerns, problems or updates.   Thank you very much for allowing me to participate in the care of this nice patient. If I can be of any further assistance to you please do not hesitate to talk to me.   Sincerely,   Joseph Age, MD, PhD

## 2017-05-04 NOTE — Patient Instructions (Addendum)

## 2017-05-27 ENCOUNTER — Telehealth: Payer: Self-pay

## 2017-05-27 DIAGNOSIS — R413 Other amnesia: Secondary | ICD-10-CM

## 2017-05-27 DIAGNOSIS — R0683 Snoring: Secondary | ICD-10-CM

## 2017-05-27 NOTE — Telephone Encounter (Signed)
HST order placed. 

## 2017-05-27 NOTE — Telephone Encounter (Signed)
[employer] denied in-lab study. Narcotics does not get approved now. Need HST order, Thanks

## 2017-06-30 ENCOUNTER — Ambulatory Visit (INDEPENDENT_AMBULATORY_CARE_PROVIDER_SITE_OTHER): Payer: Medicare HMO | Admitting: Neurology

## 2017-06-30 DIAGNOSIS — G4710 Hypersomnia, unspecified: Secondary | ICD-10-CM | POA: Diagnosis not present

## 2017-06-30 DIAGNOSIS — R0683 Snoring: Secondary | ICD-10-CM

## 2017-06-30 DIAGNOSIS — R413 Other amnesia: Secondary | ICD-10-CM

## 2017-06-30 DIAGNOSIS — G479 Sleep disorder, unspecified: Secondary | ICD-10-CM

## 2017-07-12 ENCOUNTER — Telehealth: Payer: Self-pay

## 2017-07-12 NOTE — Procedures (Signed)
  [facility] [address] [place], [place] [address] NAME:  Joseph Lawson                                                        DOB: 10/02/1940 MEDICAL RECORD [id number]                                      DOS: 06/30/17 REFERRING PHYSICIAN: Floyde Parkins, MD STUDY PERFORMED: HST HISTORY: 77 year old man with a history of memory loss, arthritis, diabetes, neuropathy, status post back surgery and obesity, who reports sleep difficulties. Epworth sleepiness score is 7 out of 24, BMI: 31.5.   STUDY RESULTS: Total Recording Time: 7 hours, 51 minutes (valid testing time: 7 hours, 39 minutes) Total Apnea/Hypopnea Index (AHI): 3.5/h Average Oxygen Saturation:    92% Lowest Oxygen Desaturation:   86%  Total Time Oxygen Saturation Below or at 88%: 2 mins  Average Heart Rate:  64 bpm  IMPRESSION: Snoring, Sleep disturbance RECOMMENDATION: This home sleep test does not demonstrate any significant obstructive or central sleep disordered breathing, overall AHI was below 5/hour. Some snoring was noted. Other causes of the patient's symptoms, including circadian rhythm disturbances, an underlying mood disorder, medication effect and/or an underlying medical problem cannot be ruled out based on this test. Clinical correlation is recommended. The patient should be cautioned not to drive, work at heights, or operate dangerous or heavy equipment when tired or sleepy. Review and reiteration of good sleep hygiene measures should be pursued with any patient. The patient and her referring provider will be notified of the test results.    I certify that I have reviewed the raw data recording prior to the issuance of this report in accordance with the standards of the American Academy of Sleep Medicine (AASM).  Star Age, MD, PhD Diplomat, ABPN (Neurology and Sleep)

## 2017-07-12 NOTE — Progress Notes (Signed)
Patient referred by Dr. Jannifer Franklin, seen by me on 05/04/17, HST on 06/30/17.   Please call and notify the patient that the recent home sleep test did not show any significant obstructive sleep apnea. Patient can follow up with the referring provider.  Please remind patient to try to maintain good sleep hygiene, which means: Keep a regular sleep and wake schedule and make enough time for sleep (7 1/2 to 8 1/2 hours for the average adult), try not to exercise or have a meal within 2 hours of your bedtime, try to keep your bedroom conducive for sleep, that is, cool and dark, without light distractors such as an illuminated alarm clock, and refrain from watching TV right before sleep or in the middle of the night and do not keep the TV or radio on during the night. If a nightlight is used, have it away from the visual field. Also, try not to use or play on electronic devices at bedtime, such as your cell phone, tablet PC or laptop. If you like to read at bedtime on an electronic device, try to dim the background light as much as possible. Do not eat in the middle of the night. Keep pets away from the bedroom environment. For stress relief, try meditation, deep breathing exercises (there are many books and CDs available), a white noise machine or fan can help to diffuse other noise distractors, such as traffic noise. Do not drink alcohol before bedtime, as it can disturb sleep and cause middle of the night awakenings. Never mix alcohol and sedating medications! Avoid narcotic pain medication close to bedtime, as opioids/narcotics can suppress breathing drive and breathing effort.    Thanks,  Star Age, MD, PhD [facility])

## 2017-07-12 NOTE — Telephone Encounter (Signed)
I called pt. I advised pt that Dr. Rexene Alberts reviewed pt's sleep study and found that did not show any significant osa. Dr. Rexene Alberts recommends that pt follow up with Dr. Jannifer Franklin. I reviewed sleep hygiene recommendations with the pt, including trying to keep a regular sleep wake schedule, avoiding electronics in the bedroom, keeping the bedroom cool, dark, and quiet, and avoiding eating or exercising within 2 hours of bedtime as well as eating in the middle of the night. I advised pt to keep pets out of the bedroom. I discussed with pt the importance of stress relief and to try meditation, deep breathing exercises, and/or a white noise machine or fan to diffuse other noise distracters. I advised pt to not drink alcohol before bedtime and to never mix alcohol and sedating medications. Pt was advised to avoid narcotic pain medication close to bedtime. I advised pt that a copy of these sleep study results will be sent to Dr. Jannifer Franklin. Pt verbalized understanding of results. Pt had no questions at this time but was encouraged to call back if questions arise.

## 2017-07-12 NOTE — Telephone Encounter (Signed)
-----   Message from Star Age, MD sent at 07/12/2017  8:18 AM EST ----- Patient referred by Dr. Jannifer Franklin, seen by me on 05/04/17, HST on 06/30/17.   Please call and notify the patient that the recent home sleep test did not show any significant obstructive sleep apnea. Patient can follow up with the referring provider.  Please remind patient to try to maintain good sleep hygiene, which means: Keep a regular sleep and wake schedule and make enough time for sleep (7 1/2 to 8 1/2 hours for the average adult), try not to exercise or have a meal within 2 hours of your bedtime, try to keep your bedroom conducive for sleep, that is, cool and dark, without light distractors such as an illuminated alarm clock, and refrain from watching TV right before sleep or in the middle of the night and do not keep the TV or radio on during the night. If a nightlight is used, have it away from the visual field. Also, try not to use or play on electronic devices at bedtime, such as your cell phone, tablet PC or laptop. If you like to read at bedtime on an electronic device, try to dim the background light as much as possible. Do not eat in the middle of the night. Keep pets away from the bedroom environment. For stress relief, try meditation, deep breathing exercises (there are many books and CDs available), a white noise machine or fan can help to diffuse other noise distractors, such as traffic noise. Do not drink alcohol before bedtime, as it can disturb sleep and cause middle of the night awakenings. Never mix alcohol and sedating medications! Avoid narcotic pain medication close to bedtime, as opioids/narcotics can suppress breathing drive and breathing effort.    Thanks,  Star Age, MD, PhD [facility])

## 2017-08-17 ENCOUNTER — Telehealth: Payer: Self-pay | Admitting: Neurology

## 2017-08-17 MED ORDER — DONEPEZIL HCL 5 MG PO TABS: 5.0000 mg | ORAL_TABLET | Freq: Every day | ORAL | 1 refills | Status: DC

## 2017-08-17 NOTE — Telephone Encounter (Signed)
E-scribed refill qty 30 with 1 refill to pharmacy as requested. Pt has a follow up with MM,NP scheduled for 09/30/17 at 930am.

## 2017-08-17 NOTE — Telephone Encounter (Signed)
Patient's wife is calling to get a refill for donepezil (ARICEPT) 5 MG tablet called to [facility] in [place]. She says patient has not taken since November and she did not know he was not taking.medication.

## 2017-08-17 NOTE — Addendum Note (Signed)
Addended by: Hope Pigeon on: 08/17/2017 04:12 PM   Modules accepted: Orders

## 2017-09-30 ENCOUNTER — Ambulatory Visit: Payer: Medicare HMO | Admitting: Adult Health

## 2017-09-30 ENCOUNTER — Encounter: Payer: Self-pay | Admitting: Adult Health

## 2017-09-30 VITALS — BP 134/72 | HR 81 | Ht 74.0 in | Wt 257.0 lb

## 2017-09-30 DIAGNOSIS — R413 Other amnesia: Secondary | ICD-10-CM

## 2017-09-30 MED ORDER — DONEPEZIL HCL 10 MG PO TABS: 10.0000 mg | ORAL_TABLET | Freq: Every day | ORAL | 3 refills | Status: DC

## 2017-09-30 NOTE — Progress Notes (Signed)
I have read the note, and I agree with the clinical assessment and plan.  Charles K Willis   

## 2017-09-30 NOTE — Progress Notes (Signed)
PATIENT: Joseph Lawson DOB: [birth year]  REASON FOR VISIT: follow up HISTORY FROM: patient  HISTORY OF PRESENT ILLNESS: Today 09/30/17 Joseph Lawson is a 77 year old male with a history of memory disturbance.  He returns today for follow-up.  He reports that his memory is fine however his wife disagrees.  She states that his short-term memory has gotten worse.  She reports that he does not remember conversations.  States that he did not remember how to get to this appointment.  Patient states that he is able to complete all ADLs independently. he does operate a motor vehicle without difficulty.  He is able to manage his finances although his wife pays most of the bills.  Patient denies any trouble sleeping.  His wife states that he sleeps most of the day.  He did have a sleep study that was relatively unremarkable.  The patient is on Aricept 5 mg daily.  His wife states that he was off medication for approximately 3 months but it was restarted.  He returns today for an evaluation   STORY 04/01/17: Joseph Lawson is a 77 year old right-handed white male with a history of a slowly progressive memory disorder that began about 12 months ago. The patient comes in with his son and wife. The patient has noted some problems with remembering names for people but this has been a lifelong problem for him. He does have some mild short-term memory issues, he denies any problems with word finding. He operates a Teacher, music, but he does have some troubles with directions, he reports no safety issues. He does not do the finances, his wife has always done this. He is able to keep up with some medications and he has some difficulty keeping up with appointments. The patient currently indicates that he sleeps fairly well at night, but his wife has clearly noted episodes of sleep apnea in the past. She claims that his snoring has improved over the last 4 or 5 years, but his energy level has dramatically changed. Over the last 4 or 5  months he has had a significant change in his overall activity level with a drop-off in his ability to do things during the day. If he is inactive, he will tend to drop off to sleep. The patient believes that he sleeps fairly well at night. He denies that he feels fatigued during the day but his wife contradicts this. The patient reports no numbness or weakness of the face, arms, or legs. The patient has no changes in balance or difficulty controlling the bowels or the bladder. The patient claims that his maternal and paternal grandfathers had dementia, his mother, several aunts and uncles also have Alzheimer's disease. The patient is sent to this office for further evaluation.    REVIEW OF SYSTEMS: Out of a complete 14 system review of symptoms, the patient complains only of the following symptoms, and all other reviewed systems are negative.  Daytime sleepiness, ringing in ears  ALLERGIES: No Known Allergies  HOME MEDICATIONS: Outpatient Medications Prior to Visit  Medication Sig Dispense Refill  . aspirin 81 MG tablet Take 81 mg by mouth daily.    Marland Kitchen donepezil (ARICEPT) 5 MG tablet Take 1 tablet (5 mg total) by mouth at bedtime. 30 tablet 1  . lisinopril (PRINIVIL,ZESTRIL) 5 MG tablet TAKE 1 TABLET BY MOUTH EVERY DAY    . metFORMIN (GLUCOPHAGE) 1000 MG tablet     . pioglitazone-metformin (ACTOPLUS MET) 15-500 MG per tablet Take 2 tablets by mouth  2 (two) times daily with a meal. Takes 2 tabs 2x daily    . terbinafine (LAMISIL) 250 MG tablet     . gabapentin (NEURONTIN) 100 MG capsule Take 300 mg by mouth 2 (two) times daily.    Marland Kitchen HYDROcodone-acetaminophen (NORCO/VICODIN) 5-325 MG tablet     . pioglitazone (ACTOS) 15 MG tablet      No facility-administered medications prior to visit.     PAST MEDICAL HISTORY: Past Medical History:  Diagnosis Date  . Arthritis   . Cancer ([place])   . Diabetes mellitus without complication ([place])   . Heart murmur   . Memory disorder 04/01/2017  .  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 Wears glasses     PAST SURGICAL HISTORY: Past Surgical History:  Procedure Laterality Date  . BACK SURGERY  1995   lumb lam  . COLONOSCOPY    . DENTAL RESTORATION/EXTRACTION WITH X-RAY    . KNEE ARTHROSCOPY  2012   right  . SALIVARY GLAND SURGERY  1980   right neck  . SHOULDER OPEN ROTATOR CUFF REPAIR Right 09/05/2012   Procedure: RIGHT SHOULDER ROTATOR CUFF REPAIR WITH DISTAL CLAVICLE EXCISION/BICEPS TENOTOMY;  Surgeon: Jolyn Nap, MD;  Location: [facility];  Service: Orthopedics;  Laterality: Right;    FAMILY HISTORY: Family History  Problem Relation Age of Onset  . Dementia Mother   . Alzheimer's disease Mother   . Dementia Father     SOCIAL HISTORY: Social History   Socioeconomic History  . Marital status: Married    Spouse name: Not on file  . Number of children: Not on file  . Years of education: Not on file  . Highest education level: Not on file  Occupational History  . Not on file  Social Needs  . Financial resource strain: Not on file  . Food insecurity:    Worry: Not on file    Inability: Not on file  . Transportation needs:    Medical: Not on file    Non-medical: Not on file  Tobacco Use  . Smoking status: Former Smoker    Last attempt to quit: 08/30/1992    Years since quitting: 25.1  . Smokeless tobacco: Former Network engineer and Sexual Activity  . Alcohol use: Yes    Comment: occ  . Drug use: No  . Sexual activity: Not on file    Comment: still smokes occ cigar  Lifestyle  . Physical activity:    Days per week: Not on file    Minutes per session: Not on file  . Stress: Not on file  Relationships  . Social connections:    Talks on phone: Not on file    Gets together: Not on file    Attends religious service: Not on file    Active member of club or organization: Not on file    Attends meetings of clubs or organizations: Not on file    Relationship status: Not on file    . Intimate partner violence:    Fear of current or ex partner: Not on file    Emotionally abused: Not on file    Physically abused: Not on file    Forced sexual activity: Not on file  Other Topics Concern  . Not on file  Social History Narrative  . Not on file      PHYSICAL EXAM  Vitals:   09/30/17 0908  BP: 134/72  Pulse: 81  Weight: 257 lb (116.6  kg)  Height: 6' 2" (1.88 m)   Body mass index is 33 kg/m.   MMSE - Mini Mental State Exam 09/30/2017  Orientation to time 2  Orientation to Place 5  Registration 3  Attention/ Calculation 5  Recall 0  Language- name 2 objects 2  Language- repeat 1  Language- follow 3 step command 3  Language- read & follow direction 1  Write a sentence 1  Copy design 1  Total score 24      Generalized: Well developed, in no acute distress   Neurological examination  Mentation: Alert oriented to time, place, history taking. Follows all commands speech and language fluent Cranial nerve II-XII: Pupils were equal round reactive to light. Extraocular movements were full, visual field were full on confrontational test. Facial sensation and strength were normal. Uvula tongue midline. Head turning and shoulder shrug  were normal and symmetric. Motor: The motor testing reveals 5 over 5 strength of all 4 extremities. Good symmetric motor tone is noted throughout.  Sensory: Sensory testing is intact to soft touch on all 4 extremities. No evidence of extinction is noted.  Coordination: Cerebellar testing reveals good finger-nose-finger and heel-to-shin bilaterally.  Gait and station: Gait is normal. Tandem gait is slightly unsteady. Romberg is negative. No drift is seen.  Reflexes: Deep tendon reflexes are symmetric and normal bilaterally.   DIAGNOSTIC DATA (LABS, IMAGING, TESTING) - I reviewed patient records, labs, notes, testing and imaging myself where available.  Lab Results  Component Value Date   HGB 14.2 09/05/2012      ASSESSMENT  AND PLAN 77 y.o. year old male  has a past medical history of Arthritis, Cancer ([facility]), Diabetes mellitus without complication ([place]), Heart murmur, Memory disorder (04/01/2017), Neuropathy, and Wears glasses. here with:   1.  Memory disturbance  The patient's memory score has declined since the last visit.  We will increase Aricept to 10 mg at bedtime.  I have discussed potential side effects of this medication.  The patient is interested in  research trials.  He was given information about this today.  He is advised that if his symptoms worsen or he develops new symptoms he should let us know.  He will follow-up in 6 months or sooner as needed.    Ward Givens, MSN, NP-C 09/30/2017, 9:12 AM [facility] [address] [place], [place] [address] [phone]

## 2017-09-30 NOTE — Patient Instructions (Signed)
Your Plan:  Increase Aricept to 10 mg at bedtime Memory score slightly declined If your symptoms worsen or you develop new symptoms please let us know.   Thank you for coming to see [place] at [facility]. I hope we have been able to provide you high quality care today.  You may receive a patient satisfaction survey over the next few weeks. We would appreciate your feedback and comments so that we may continue to improve ourselves and the health of our patients.

## 2017-11-27 ENCOUNTER — Other Ambulatory Visit: Payer: Self-pay

## 2017-11-27 ENCOUNTER — Encounter (HOSPITAL_BASED_OUTPATIENT_CLINIC_OR_DEPARTMENT_OTHER): Payer: Self-pay | Admitting: Emergency Medicine

## 2017-11-27 ENCOUNTER — Emergency Department (HOSPITAL_BASED_OUTPATIENT_CLINIC_OR_DEPARTMENT_OTHER): Payer: Medicare HMO

## 2017-11-27 ENCOUNTER — Emergency Department (HOSPITAL_BASED_OUTPATIENT_CLINIC_OR_DEPARTMENT_OTHER)
Admission: EM | Admit: 2017-11-27 | Discharge: 2017-11-27 | Disposition: A | Discharge status: Home / Self Care | Payer: Medicare HMO | Attending: Emergency Medicine | Admitting: Emergency Medicine

## 2017-11-27 DIAGNOSIS — Z7984 Long term (current) use of oral hypoglycemic drugs: Secondary | ICD-10-CM | POA: Diagnosis not present

## 2017-11-27 DIAGNOSIS — Y929 Unspecified place or not applicable: Secondary | ICD-10-CM | POA: Diagnosis not present

## 2017-11-27 DIAGNOSIS — Z859 Personal history of malignant neoplasm, unspecified: Secondary | ICD-10-CM | POA: Insufficient documentation

## 2017-11-27 DIAGNOSIS — Z7982 Long term (current) use of aspirin: Secondary | ICD-10-CM | POA: Insufficient documentation

## 2017-11-27 DIAGNOSIS — Y9389 Activity, other specified: Secondary | ICD-10-CM | POA: Diagnosis not present

## 2017-11-27 DIAGNOSIS — Z87891 Personal history of nicotine dependence: Secondary | ICD-10-CM | POA: Diagnosis not present

## 2017-11-27 DIAGNOSIS — Y999 Unspecified external cause status: Secondary | ICD-10-CM | POA: Diagnosis not present

## 2017-11-27 DIAGNOSIS — Z79899 Other long term (current) drug therapy: Secondary | ICD-10-CM | POA: Diagnosis not present

## 2017-11-27 DIAGNOSIS — W312XXA Contact with powered woodworking and forming machines, initial encounter: Secondary | ICD-10-CM | POA: Insufficient documentation

## 2017-11-27 DIAGNOSIS — S61411A Laceration without foreign body of right hand, initial encounter: Secondary | ICD-10-CM | POA: Insufficient documentation

## 2017-11-27 DIAGNOSIS — E119 Type 2 diabetes mellitus without complications: Secondary | ICD-10-CM | POA: Diagnosis not present

## 2017-11-27 DIAGNOSIS — Z23 Encounter for immunization: Secondary | ICD-10-CM | POA: Insufficient documentation

## 2017-11-27 LAB — CBG MONITORING, ED: Glucose-Capillary: 219 mg/dL — ABNORMAL HIGH (ref 65–99)

## 2017-11-27 MED ORDER — LIDOCAINE HCL (PF) 1 % IJ SOLN
10.0000 mL | Freq: Once | INTRAMUSCULAR | Status: AC
  Administered 2017-11-27: 10 mL
  Filled 2017-11-27: qty 10

## 2017-11-27 MED ORDER — TETANUS-DIPHTH-ACELL PERTUSSIS 5-2.5-18.5 LF-MCG/0.5 IM SUSP
0.5000 mL | Freq: Once | INTRAMUSCULAR | Status: AC
  Administered 2017-11-27: 0.5 mL via INTRAMUSCULAR
  Filled 2017-11-27: qty 0.5

## 2017-11-27 NOTE — ED Notes (Signed)
Pt and SO given d/c instructions as per chart. Verbalize understanding. No questions.

## 2017-11-27 NOTE — ED Provider Notes (Signed)
[facility] EMERGENCY DEPARTMENT Provider Note   CSN: [id number] Arrival date & time: 11/27/17  1738     History   Chief Complaint Chief Complaint  Patient presents with  . Laceration    HPI Joseph Lawson is a 77 y.o. male with a hx of arthritis and DM who presents to the ED for R hand laceration which occurred at 1600 today. Patient states he was working with a drill when a piece of the drill cut the base of the right thumb. States he was able to control bleeding. Washed the area out with soap and water PTA. Rates pain to area a 2/10 in severity, worse with movement. No other areas of injury. Denies numbness or weakness. Last tetanus > 10 years. Patient is R hand dominant.   HPI  Past Medical History:  Diagnosis Date  . Arthritis   . Cancer ([place])   . Diabetes mellitus without complication ([place])   . Heart murmur   . Memory disorder 04/01/2017  . 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 Wears glasses     Patient Active Problem List   Diagnosis Date Noted  . Memory disorder 04/01/2017    Past Surgical History:  Procedure Laterality Date  . BACK SURGERY  1995   lumb lam  . COLONOSCOPY    . DENTAL RESTORATION/EXTRACTION WITH X-RAY    . KNEE ARTHROSCOPY  2012   right  . SALIVARY GLAND SURGERY  1980   right neck  . SHOULDER OPEN ROTATOR CUFF REPAIR Right 09/05/2012   Procedure: RIGHT SHOULDER ROTATOR CUFF REPAIR WITH DISTAL CLAVICLE EXCISION/BICEPS TENOTOMY;  Surgeon: Jolyn Nap, MD;  Location: [facility];  Service: Orthopedics;  Laterality: Right;        Home Medications    Prior to Admission medications   Medication Sig Start Date End Date Taking? Authorizing Provider  aspirin 81 MG tablet Take 81 mg by mouth daily.    [provider]  donepezil (ARICEPT) 10 MG tablet Take 1 tablet (10 mg total) by mouth at bedtime. 09/30/17   Ward Givens, NP  lisinopril (PRINIVIL,ZESTRIL) 5 MG tablet TAKE 1 TABLET BY MOUTH EVERY  DAY 10/14/16   [provider]  metFORMIN (GLUCOPHAGE) 1000 MG tablet  02/10/17   [provider]  pioglitazone-metformin (ACTOPLUS MET) 15-500 MG per tablet Take 2 tablets by mouth 2 (two) times daily with a meal. Takes 2 tabs 2x daily    [provider]  terbinafine (LAMISIL) 250 MG tablet  03/26/17   [provider]    Family History Family History  Problem Relation Age of Onset  . Dementia Mother   . Alzheimer's disease Mother   . Dementia Father     Social History Social History   Tobacco Use  . Smoking status: Former Smoker    Last attempt to quit: 08/30/1992    Years since quitting: 25.2  . Smokeless tobacco: Former Network engineer Use Topics  . Alcohol use: Yes    Comment: occ  . Drug use: No     Allergies   Patient has no known allergies.   Review of Systems Review of Systems  Constitutional: Negative for chills and fever.  Skin: Positive for wound (to base of R thumb, painful).  Neurological: Negative for weakness and numbness.       Negative for paresthesia     Physical Exam Updated Vital Signs BP (!) 179/79 (BP Location: Right Arm)   Pulse  90   Temp 97.9 F (36.6 C) (Oral)   Resp 18   Ht _0  (1.905 m)   Wt 108.9 kg (240 lb)   SpO2 100%   BMI 30.00 kg/m   Physical Exam  Constitutional: He appears well-developed and well-nourished. No distress.  HENT:  Head: Normocephalic and atraumatic.  Eyes: Conjunctivae are normal. Right eye exhibits no discharge. Left eye exhibits no discharge.  Cardiovascular:  Pulses:      Radial pulses are 2+ on the right side, and 2+ on the left side.  Musculoskeletal:  Upper extremities: Patient has a 3 cm skin tear type laceration to the base of the R 1st digit in area of palmar 1st MCP which extends to inter-web space of 1st and 2nd digit . There is a 0.75 cm laceration just inferior to this on the palm. No active bleeding. No appreciable foreign bodies.  No obvious deformities,  appreciable swelling, drainage, erythema, or ecchymosis.  Patient has normal range of motion to bilateral wrists, and all digits (MCPs/PIPs/DIPs).  He is able to perform all range of motion with his right thumb including opposition.  Minimal tenderness to palpation over the area of the laceration, otherwise upper extremities are nontender.  Neurological: He is alert.  Clear speech.  Sensation grossly intact bilateral upper extremities.  Patient has 5 out of 5 symmetric grip strength.  He is able to perform okay sign, thumbs up, and cross second and third digits.  Skin: Skin is warm and dry. Capillary refill takes less than 2 seconds.  Psychiatric: He has a normal mood and affect. His behavior is normal. Thought content normal.  Nursing note and vitals reviewed.       ED Treatments / Results  Labs (all labs ordered are listed, but only abnormal results are displayed) Labs Reviewed  CBG MONITORING, ED - Abnormal; Notable for the following components:      Result Value   Glucose-Capillary 219 (*)    All other components within normal limits    EKG None  Radiology Dg Hand Complete Right  Result Date: 11/27/2017 CLINICAL DATA:  Laceration EXAM: RIGHT HAND - COMPLETE 3+ VIEW COMPARISON:  None. FINDINGS: No fracture or malalignment. No radiopaque foreign body in the soft tissues. Mild degenerative changes at the DIP joints. IMPRESSION: No acute osseous abnormality.  No radiopaque foreign body. Electronically Signed   By: Donavan Foil M.D.   On: 11/27/2017 19:16    Procedures .Marland KitchenLaceration Repair Date/Time: 11/27/2017 8:16 PM Performed by: Amaryllis Dyke, PA-C Authorized by: Amaryllis Dyke, PA-C   Consent:    Consent obtained:  Verbal   Consent given by:  Patient   Risks discussed:  Infection, pain, retained foreign body, need for additional repair, nerve damage, poor cosmetic result, poor wound healing, tendon damage and vascular damage   Alternatives discussed:  No  treatment Anesthesia (see MAR for exact dosages):    Anesthesia method:  Local infiltration   Local anesthetic:  Lidocaine 1% w/o epi Laceration details:    Location:  Hand   Hand location:  R palm   Length (cm):  3 Repair type:    Repair type:  Simple Pre-procedure details:    Preparation:  Patient was prepped and draped in usual sterile fashion and imaging obtained to evaluate for foreign bodies Exploration:    Hemostasis achieved with:  Direct pressure   Wound exploration: wound explored through full range of motion and entire depth of wound probed and visualized  Contaminated: no   Treatment:    Area cleansed with:  Betadine   Amount of cleaning:  Standard   Irrigation solution:  Sterile water   Irrigation volume:  1L   Irrigation method:  Pressure wash Skin repair:    Repair method:  Sutures   Suture size:  4-0   Suture material:  Nylon   Suture technique:  Simple interrupted   Number of sutures:  8 Approximation:    Approximation:  Close Post-procedure details:    Dressing:  Antibiotic ointment, non-adherent dressing, adhesive bandage and bulky dressing   Patient tolerance of procedure:  Tolerated well, no immediate complications .Marland KitchenLaceration Repair Date/Time: 11/27/2017 8:17 PM Performed by: Amaryllis Dyke, PA-C Authorized by: Amaryllis Dyke, PA-C   Consent:    Consent obtained:  Verbal   Consent given by:  Patient   Risks discussed:  Infection, need for additional repair, nerve damage, pain, poor cosmetic result, poor wound healing, vascular damage, tendon damage and retained foreign body   Alternatives discussed:  No treatment Anesthesia (see MAR for exact dosages):    Anesthesia method:  Local infiltration   Local anesthetic:  Lidocaine 1% w/o epi Laceration details:    Location:  Hand   Hand location:  R palm   Length (cm):  0.8 Repair type:    Repair type:  Simple Pre-procedure details:    Preparation:  Patient was prepped and draped  in usual sterile fashion and imaging obtained to evaluate for foreign bodies Exploration:    Hemostasis achieved with:  Direct pressure   Wound exploration: wound explored through full range of motion and entire depth of wound probed and visualized     Contaminated: no   Treatment:    Area cleansed with:  Betadine   Amount of cleaning:  Standard   Irrigation solution:  Sterile water   Irrigation volume:  1L   Irrigation method:  Pressure wash Skin repair:    Repair method:  Sutures   Suture size:  4-0   Suture material:  Nylon   Suture technique:  Simple interrupted   Number of sutures:  2 Approximation:    Approximation:  Close Post-procedure details:    Dressing:  Antibiotic ointment, non-adherent dressing, adhesive bandage and bulky dressing   Patient tolerance of procedure:  Tolerated well, no immediate complications   (including critical care time)  Medications Ordered in ED Medications - No data to display   Initial Impression / Assessment and Plan / ED Course  I have reviewed the triage vital signs and the nursing notes.  Pertinent labs & imaging results that were available during my care of the patient were reviewed by me and considered in my medical decision making (see chart for details).   Patient presents to the ED s/p R hand injury with lacerations. Patient nontoxic appearing, in no apparent distress, vitals notable for elevated BP, doubt HTN emergency, improved somewhat in ER, patient aware of need for recheck. Patient has 3 cm skin tear/flap type laceration to the base of the palmar aspect of the R thumb with smaller 0.75 cm laceration just inferior to this. Pressure irrigation performed.  Woundd explored and base of wound visualized in a bloodless field without evidence of foreign body. Repair per procedure note above, tolerated well. Tetanus updated at today's visit. Discussed suture home care with patient and answered questions. Patient to follow-up for wound check  and suture removal in 7 days; they are to return to the ED sooner for signs  of infection.I discussed results, treatment plan, need for follow-up for wound check and suture removal, and return precautions with the patient and wife at bedside. Provided opportunity for questions, patient and wife confirmed understanding and are in agreement with plan.    Vitals:   11/27/17 1750 11/27/17 2012  BP: (!) 179/79 (!) 156/84  Pulse: 90 86  Resp: 18 18  Temp: 97.9 F (36.6 C)   SpO2: 100% 99%   Findings and plan of care discussed with supervising physician Dr. Rogene Houston who is in agreement with plan.   Final Clinical Impressions(s) / ED Diagnoses   Final diagnoses:  Laceration of right hand without foreign body, initial encounter    ED Discharge Orders    None       Amaryllis Dyke, PA-C 11/27/17 2023    Fredia Sorrow, MD 11/28/17 [phone]

## 2017-11-27 NOTE — Discharge Instructions (Addendum)
You seen in the emergency department today for a laceration to your right hand.  A total of 10 stitches were placed into the lacerations (8 into the larger, 2 into the smaller).  Keep this area clean and dry for the next 24 hours.  After 24 hours she may get the area wet but do not soak it.  You need to have the stitches removed in 7 days.  Return to the ER, go to an urgent care, or go to your primary care provider to have this performed.  Return to the ER sooner for discharge from the wound, redness around the wound, fevers, or any other concerns.  Additionally your blood pressure was elevated in the emergency department today.  Please have this rechecked by your primary care provider within 1 month.  Vitals:   11/27/17 1750 11/27/17 2012  BP: (!) 179/79 (!) 156/84  Pulse: 90 86  Resp: 18 18  Temp: 97.9 F (36.6 C)   SpO2: 100% 99%

## 2017-11-27 NOTE — ED Triage Notes (Addendum)
Patient states that he was drilling a piece of tin and the drill bit hit his right hand. Patient hand - bleeding in controlled

## 2017-12-04 ENCOUNTER — Other Ambulatory Visit: Payer: Self-pay

## 2017-12-04 ENCOUNTER — Emergency Department (HOSPITAL_BASED_OUTPATIENT_CLINIC_OR_DEPARTMENT_OTHER)
Admission: EM | Admit: 2017-12-04 | Discharge: 2017-12-04 | Disposition: A | Discharge status: Home / Self Care | Payer: Medicare HMO | Attending: Emergency Medicine | Admitting: Emergency Medicine

## 2017-12-04 ENCOUNTER — Encounter (HOSPITAL_BASED_OUTPATIENT_CLINIC_OR_DEPARTMENT_OTHER): Payer: Self-pay | Admitting: Emergency Medicine

## 2017-12-04 DIAGNOSIS — Z4802 Encounter for removal of sutures: Secondary | ICD-10-CM

## 2017-12-04 DIAGNOSIS — Z79899 Other long term (current) drug therapy: Secondary | ICD-10-CM | POA: Insufficient documentation

## 2017-12-04 DIAGNOSIS — Z87891 Personal history of nicotine dependence: Secondary | ICD-10-CM | POA: Insufficient documentation

## 2017-12-04 DIAGNOSIS — Z859 Personal history of malignant neoplasm, unspecified: Secondary | ICD-10-CM | POA: Diagnosis not present

## 2017-12-04 DIAGNOSIS — E119 Type 2 diabetes mellitus without complications: Secondary | ICD-10-CM | POA: Diagnosis not present

## 2017-12-04 DIAGNOSIS — Z7982 Long term (current) use of aspirin: Secondary | ICD-10-CM | POA: Diagnosis not present

## 2017-12-04 NOTE — ED Triage Notes (Signed)
Pt here for suture removal to R hand. Sutures placed on 6/15

## 2017-12-04 NOTE — ED Provider Notes (Addendum)
[facility] Provider Note   CSN: [id number] Arrival date & time: 12/04/17  0841     History   Chief Complaint Chief Complaint  Patient presents with  . Suture / Staple Removal    HPI Pharoah Goggins is a 77 y.o. male.  HPI  77 year old male presents for suture removal.  7 days ago he suffered a laceration/tear to his right hand.  He states that since then he is noticed a couple of the sutures have come out.  He had a little bit of yellow drainage.  Is a little sore but the pain is much better.  Past Medical History:  Diagnosis Date  . Arthritis   . Cancer ([place])   . Diabetes mellitus without complication ([place])   . Heart murmur   . Memory disorder 04/01/2017  . 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 Wears glasses     Patient Active Problem List   Diagnosis Date Noted  . Memory disorder 04/01/2017    Past Surgical History:  Procedure Laterality Date  . BACK SURGERY  1995   lumb lam  . COLONOSCOPY    . DENTAL RESTORATION/EXTRACTION WITH X-RAY    . KNEE ARTHROSCOPY  2012   right  . SALIVARY GLAND SURGERY  1980   right neck  . SHOULDER OPEN ROTATOR CUFF REPAIR Right 09/05/2012   Procedure: RIGHT SHOULDER ROTATOR CUFF REPAIR WITH DISTAL CLAVICLE EXCISION/BICEPS TENOTOMY;  Surgeon: Jolyn Nap, MD;  Location: [facility];  Service: Orthopedics;  Laterality: Right;        Home Medications    Prior to Admission medications   Medication Sig Start Date End Date Taking? Authorizing Provider  aspirin 81 MG tablet Take 81 mg by mouth daily.    [provider]  donepezil (ARICEPT) 10 MG tablet Take 1 tablet (10 mg total) by mouth at bedtime. 09/30/17   Ward Givens, NP  lisinopril (PRINIVIL,ZESTRIL) 5 MG tablet TAKE 1 TABLET BY MOUTH EVERY DAY 10/14/16   [provider]  metFORMIN (GLUCOPHAGE) 1000 MG tablet  02/10/17   [provider]  pioglitazone-metformin (ACTOPLUS MET) 15-500 MG per  tablet Take 2 tablets by mouth 2 (two) times daily with a meal. Takes 2 tabs 2x daily    [provider]  terbinafine (LAMISIL) 250 MG tablet  03/26/17   [provider]    Family History Family History  Problem Relation Age of Onset  . Dementia Mother   . Alzheimer's disease Mother   . Dementia Father     Social History Social History   Tobacco Use  . Smoking status: Former Smoker    Last attempt to quit: 08/30/1992    Years since quitting: 25.2  . Smokeless tobacco: Former Network engineer Use Topics  . Alcohol use: Yes    Comment: occ  . Drug use: No     Allergies   Patient has no known allergies.   Review of Systems Review of Systems  Skin: Positive for wound.     Physical Exam Updated Vital Signs BP (!) 147/68 (BP Location: Left Arm)   Pulse 88   Temp 98 F (36.7 C) (Oral)   Resp 18   Ht 6' 3"  (1.905 m)   Wt 108.9 kg (240 lb)   SpO2 100%   BMI 30.00 kg/m   Physical Exam  Constitutional: He appears well-developed and well-nourished.  HENT:  Head: Normocephalic and atraumatic.  Nose: Nose normal.  Pulmonary/Chest: Effort normal.  Abdominal: He exhibits no distension.  Musculoskeletal:  No significant tenderness.  No erythema.  See picture.  Neurological: He is alert.  Skin: Skin is warm and dry. He is not diaphoretic. No erythema.  Nursing note and vitals reviewed.      ED Treatments / Results  Labs (all labs ordered are listed, but only abnormal results are displayed) Labs Reviewed - No data to display  EKG None  Radiology No results found.  Procedures .Suture Removal Date/Time: 12/04/2017 8:58 AM Performed by: Sherwood Gambler, MD Authorized by: Sherwood Gambler, MD   Consent:    Consent obtained:  Verbal   Consent given by:  Patient Location:    Location:  Upper extremity   Upper extremity location:  Hand   Hand location:  R hand Procedure details:    Wound appearance:  Nonpurulent   Number of sutures  removed:  2 Post-procedure details:    Patient tolerance of procedure:  Tolerated well, no immediate complications   (including critical care time)  Medications Ordered in ED Medications - No data to display   Initial Impression / Assessment and Plan / ED Course  I have reviewed the triage vital signs and the nursing notes.  Pertinent labs & imaging results that were available during my care of the patient were reviewed by me and considered in my medical decision making (see chart for details).     No swelling or erythema appreciated to right hand.  A couple sutures appear to have already come out.  This may not heal fully but I think if I took the sutures out today, the wound would open up significantly.  I will give it a few more days and have him return here or to his primary care doctors for further suture removal.  However does not appear grossly infected and I do not think he needs antibiotics.  On the more medial wound that was small I removed 2 sutures.  Final Clinical Impressions(s) / ED Diagnoses   Final diagnoses:  Visit for suture removal    ED Discharge Orders    None       Sherwood Gambler, MD 12/04/17 1610    Sherwood Gambler, MD 12/04/17 [phone]

## 2017-12-08 ENCOUNTER — Encounter (HOSPITAL_BASED_OUTPATIENT_CLINIC_OR_DEPARTMENT_OTHER): Payer: Self-pay | Admitting: Emergency Medicine

## 2017-12-08 ENCOUNTER — Other Ambulatory Visit: Payer: Self-pay

## 2017-12-08 ENCOUNTER — Emergency Department (HOSPITAL_BASED_OUTPATIENT_CLINIC_OR_DEPARTMENT_OTHER)
Admission: EM | Admit: 2017-12-08 | Discharge: 2017-12-08 | Disposition: A | Discharge status: Home / Self Care | Payer: Medicare HMO | Attending: Emergency Medicine | Admitting: Emergency Medicine

## 2017-12-08 DIAGNOSIS — Z859 Personal history of malignant neoplasm, unspecified: Secondary | ICD-10-CM | POA: Insufficient documentation

## 2017-12-08 DIAGNOSIS — Z79899 Other long term (current) drug therapy: Secondary | ICD-10-CM | POA: Insufficient documentation

## 2017-12-08 DIAGNOSIS — Z7984 Long term (current) use of oral hypoglycemic drugs: Secondary | ICD-10-CM | POA: Diagnosis not present

## 2017-12-08 DIAGNOSIS — Z4802 Encounter for removal of sutures: Secondary | ICD-10-CM

## 2017-12-08 DIAGNOSIS — S61411D Laceration without foreign body of right hand, subsequent encounter: Secondary | ICD-10-CM | POA: Diagnosis not present

## 2017-12-08 DIAGNOSIS — W311XXD Contact with metalworking machines, subsequent encounter: Secondary | ICD-10-CM | POA: Diagnosis not present

## 2017-12-08 DIAGNOSIS — E1140 Type 2 diabetes mellitus with diabetic neuropathy, unspecified: Secondary | ICD-10-CM | POA: Insufficient documentation

## 2017-12-08 DIAGNOSIS — S6991XD Unspecified injury of right wrist, hand and finger(s), subsequent encounter: Secondary | ICD-10-CM | POA: Diagnosis present

## 2017-12-08 DIAGNOSIS — Z87891 Personal history of nicotine dependence: Secondary | ICD-10-CM | POA: Insufficient documentation

## 2017-12-08 DIAGNOSIS — Z7982 Long term (current) use of aspirin: Secondary | ICD-10-CM | POA: Insufficient documentation

## 2017-12-08 NOTE — ED Provider Notes (Signed)
[facility] Provider Note  CSN: [id number] Arrival date & time: 12/08/17  0017    History   Chief Complaint Chief Complaint  Patient presents with  . Suture / Staple Removal    HPI Joseph Lawson is a 77 y.o. male with a medical history of Type 2 DM, neuropathy and cancer who presented to the ED for suture removal. Patient had laceration repair on 11/27/17 on his right hand following an accident with a drill. 2 sutures removed on 12/04/17. Patient denies fever, erythema, tenderness or drainage from laceration site. Patient states he has full strength and ROM in left hand.  Past Medical History:  Diagnosis Date  . Arthritis   . Cancer ([place])   . Diabetes mellitus without complication ([place])   . Heart murmur   . Memory disorder 04/01/2017  . 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 Wears glasses     Patient Active Problem List   Diagnosis Date Noted  . Memory disorder 04/01/2017    Past Surgical History:  Procedure Laterality Date  . BACK SURGERY  1995   lumb lam  . COLONOSCOPY    . DENTAL RESTORATION/EXTRACTION WITH X-RAY    . KNEE ARTHROSCOPY  2012   right  . SALIVARY GLAND SURGERY  1980   right neck  . SHOULDER OPEN ROTATOR CUFF REPAIR Right 09/05/2012   Procedure: RIGHT SHOULDER ROTATOR CUFF REPAIR WITH DISTAL CLAVICLE EXCISION/BICEPS TENOTOMY;  Surgeon: Jolyn Nap, MD;  Location: [facility];  Service: Orthopedics;  Laterality: Right;        Home Medications    Prior to Admission medications   Medication Sig Start Date End Date Taking? Authorizing Provider  pioglitazone (ACTOS) 15 MG tablet Take 15 mg by mouth 2 (two) times daily.   Yes [provider]  aspirin 81 MG tablet Take 81 mg by mouth daily.    [provider]  donepezil (ARICEPT) 10 MG tablet Take 1 tablet (10 mg total) by mouth at bedtime. 09/30/17   Ward Givens, NP  lisinopril (PRINIVIL,ZESTRIL) 5 MG tablet TAKE 1 TABLET BY  MOUTH EVERY DAY 10/14/16   [provider]  metFORMIN (GLUCOPHAGE) 1000 MG tablet  02/10/17   [provider]  pioglitazone-metformin (ACTOPLUS MET) 15-500 MG per tablet Take 2 tablets by mouth 2 (two) times daily with a meal. Takes 2 tabs 2x daily    [provider]  terbinafine (LAMISIL) 250 MG tablet  03/26/17   [provider]    Family History Family History  Problem Relation Age of Onset  . Dementia Mother   . Alzheimer's disease Mother   . Dementia Father     Social History Social History   Tobacco Use  . Smoking status: Former Smoker    Last attempt to quit: 08/30/1992    Years since quitting: 25.2  . Smokeless tobacco: Former Network engineer Use Topics  . Alcohol use: Yes    Comment: occ  . Drug use: No     Allergies   Patient has no known allergies.   Review of Systems Review of Systems  Constitutional: Negative for chills, diaphoresis and fever.  Musculoskeletal: Negative.   Skin: Positive for wound. Negative for color change and pallor.  Neurological: Negative for weakness and numbness.  Hematological: Negative.      Physical Exam Updated Vital Signs BP 136/69 (BP Location: Right Arm)   Pulse 82   Temp 97.7 F (36.5 C) (Oral)  Resp 16   Ht _0  (1.905 m)   Wt 108.9 kg (240 lb)   SpO2 98%   BMI 30.00 kg/m   Physical Exam  Constitutional: He appears well-developed and well-nourished.  Musculoskeletal:       Right hand: He exhibits normal range of motion, no tenderness and no swelling. Normal sensation noted. Normal strength noted.  Well healing laceration between 1st and 2nd digits on right hand. 7 sutures remain. No erythema, edema or drainage from site. Full ROM and strength of right hand and digits.  Skin: Skin is warm. Capillary refill takes less than 2 seconds. Laceration noted. No erythema.     Nursing note and vitals reviewed.    ED Treatments / Results  Labs (all labs ordered are listed, but only  abnormal results are displayed) Labs Reviewed - No data to display  EKG None  Radiology No results found.  Procedures .Suture Removal Date/Time: 12/08/2017 10:29 AM Performed by: Romie Jumper, PA-C Authorized by: Maury Dus I, PA-C   Consent:    Consent obtained:  Verbal   Consent given by:  Patient   Risks discussed:  Bleeding, pain and wound separation   Alternatives discussed:  No treatment Location:    Location:  Upper extremity   Upper extremity location:  Hand   Hand location:  R hand Procedure details:    Wound appearance:  No signs of infection, good wound healing, nontender and nonpurulent   Number of sutures removed:  7 Post-procedure details:    Post-removal:  No dressing applied   Patient tolerance of procedure:  Tolerated well, no immediate complications   (including critical care time)  Medications Ordered in ED Medications - No data to display   Initial Impression / Assessment and Plan / ED Course  Triage vital signs and the nursing notes have been reviewed.  Pertinent labs & imaging results that were available during care of the patient were reviewed and considered in medical decision making (see chart for details).   Laceration from 11/27/17 is healing well. Patient has full sensation, strength and ROM in the affected hand. 7 sutures removed from patient's right hand today without complications. Laceration site is healing well and has no signs of infection that require antibiotics today. Education provided on s/s of infection that would warrant return to medical provider for evaluation.  Final Clinical Impressions(s) / ED Diagnoses   Dispo: Home. After thorough clinical evaluation, this patient is determined to be medically stable and can be safely discharged with the previously mentioned treatment and/or outpatient follow-up/referral(s). At this time, there are no other apparent medical conditions that require further screening, evaluation  or treatment.   Final diagnoses:  Visit for suture removal    ED Discharge Orders    None        Junita Push 12/08/17 1031    Fredia Sorrow, MD 12/09/17 [phone]

## 2017-12-08 NOTE — Discharge Instructions (Signed)
All sutures have been removed. You may follow-up with your PCP. Signs of infection that would warrant a visit to a medical provider: fever, redness at laceration site, warmth, unusual drainage from area, pain/tenderness.  Make sure to keep those heavy duty gloves close by.

## 2017-12-08 NOTE — ED Triage Notes (Signed)
Pt presents for suture removal from right hand, placed here on 6/15.

## 2018-03-09 ENCOUNTER — Ambulatory Visit: Payer: Medicare HMO | Admitting: Adult Health

## 2018-03-09 ENCOUNTER — Encounter: Payer: Self-pay | Admitting: Adult Health

## 2018-03-09 VITALS — BP 123/59 | HR 78 | Ht 74.0 in | Wt 230.8 lb

## 2018-03-09 DIAGNOSIS — E538 Deficiency of other specified B group vitamins: Secondary | ICD-10-CM

## 2018-03-09 DIAGNOSIS — R5383 Other fatigue: Secondary | ICD-10-CM | POA: Diagnosis not present

## 2018-03-09 DIAGNOSIS — R413 Other amnesia: Secondary | ICD-10-CM | POA: Diagnosis not present

## 2018-03-09 NOTE — Addendum Note (Signed)
Addended by: Trudie Buckler on: 03/09/2018 10:09 AM   Modules accepted: Orders

## 2018-03-09 NOTE — Patient Instructions (Signed)
Your Plan:  Continue Aricept 10 mg at bedtime Blood work today to look for causes of fatigue If your symptoms worsen or you develop new symptoms please let us know.   Thank you for coming to see [place] at [facility]. I hope we have been able to provide you high quality care today.  You may receive a patient satisfaction survey over the next few weeks. We would appreciate your feedback and comments so that we may continue to improve ourselves and the health of our patients.

## 2018-03-09 NOTE — Progress Notes (Signed)
PATIENT: Joseph Lawson DOB: June 01, 1941  REASON FOR VISIT: follow up HISTORY FROM: patient  HISTORY OF PRESENT ILLNESS: Today 03/09/18:  Joseph Lawson is a 77 year old male with a history of progressive memory disturbance.  He returns today for follow-up.  At the last visit Aricept was increased to 10 mg at bedtime.  He feels that his memory has remained the same.  He is able to complete all ADLs independently.  He does operate a Teacher, music.  Wife reports on occasion he has forgotten how to get to familiar places.  He reports that his sleep is okay.  Denies any changes in mood or behavior.  Although in private his wife reports that she is concerned that he may be depressed.  She states that he has become very reclusive.  Reports that he has no energy to complete task.  Patient denies hallucinations.  Reports good appetite.  He returns today for evaluation.  HISTORY Joseph Lawson is a 77 year old right-handed white male with a history of a slowly progressive memory disorder that began about 12 months ago. The patient comes in with his son and wife. The patient has noted some problems with remembering names for people but this has been a lifelong problem for him. He does have some mild short-term memory issues, he denies any problems with word finding. He operates a Teacher, music, but he does have some troubles with directions, he reports no safety issues. He does not do the finances, his wife has always done this. He is able to keep up with some medications and he has some difficulty keeping up with appointments. The patient currently indicates that he sleeps fairly well at night, but his wife has clearly noted episodes of sleep apnea in the past. She claims that his snoring has improved over the last 4 or 5 years, but his energy level has dramatically changed. Over the last 4 or 5 months he has had a significant change in his overall activity level with a drop-off in his ability to do things during the day. If  he is inactive, he will tend to drop off to sleep. The patient believes that he sleeps fairly well at night. He denies that he feels fatigued during the day but his wife contradicts this. The patient reports no numbness or weakness of the face, arms, or legs. The patient has no changes in balance or difficulty controlling the bowels or the bladder. The patient claims that his maternal and paternal grandfathers had dementia, his mother, several aunts and uncles also have Alzheimer's disease. The patient is sent to this office for further evaluation.  REVIEW OF SYSTEMS: Out of a complete 14 system review of symptoms, the patient complains only of the following symptoms, and all other reviewed systems are negative.  Activity change, fatigue, unexpected weight change, excessive thirst, incontinence of bladder, frequency of urination, urgency, tremors, confusion, memory loss  ALLERGIES: No Known Allergies  HOME MEDICATIONS: Outpatient Medications Prior to Visit  Medication Sig Dispense Refill  . aspirin 81 MG tablet Take 81 mg by mouth daily.    Marland Kitchen donepezil (ARICEPT) 10 MG tablet Take 1 tablet (10 mg total) by mouth at bedtime. 90 tablet 3  . glipiZIDE (GLUCOTROL) 5 MG tablet Take 5 mg by mouth 2 (two) times daily before a meal.  3  . JENTADUETO 2.10-998 MG TABS Take 1 tablet by mouth 2 (two) times daily.  3  . linaGLIPtin-metFORMIN HCl 2.10-998 MG TABS Take by mouth.    Marland Kitchen  lisinopril (PRINIVIL,ZESTRIL) 5 MG tablet TAKE 1 TABLET BY MOUTH EVERY DAY    . Semaglutide, 1 MG/DOSE, 2 MG/1.5ML SOPN Inject into the skin. weekly    . terbinafine (LAMISIL) 250 MG tablet     . metFORMIN (GLUCOPHAGE) 1000 MG tablet     . pioglitazone (ACTOS) 15 MG tablet Take 15 mg by mouth 2 (two) times daily.    . pioglitazone-metformin (ACTOPLUS MET) 15-500 MG per tablet Take 2 tablets by mouth 2 (two) times daily with a meal. Takes 2 tabs 2x daily     No facility-administered medications prior to visit.     PAST MEDICAL  HISTORY: Past Medical History:  Diagnosis Date  . Arthritis   . Cancer ([place])   . Diabetes mellitus without complication ([place])   . Heart murmur   . Memory disorder 04/01/2017  . 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 Wears glasses     PAST SURGICAL HISTORY: Past Surgical History:  Procedure Laterality Date  . BACK SURGERY  1995   lumb lam  . COLONOSCOPY    . DENTAL RESTORATION/EXTRACTION WITH X-RAY    . KNEE ARTHROSCOPY  2012   right  . SALIVARY GLAND SURGERY  1980   right neck  . SHOULDER OPEN ROTATOR CUFF REPAIR Right 09/05/2012   Procedure: RIGHT SHOULDER ROTATOR CUFF REPAIR WITH DISTAL CLAVICLE EXCISION/BICEPS TENOTOMY;  Surgeon: Jolyn Nap, MD;  Location: [facility];  Service: Orthopedics;  Laterality: Right;    FAMILY HISTORY: Family History  Problem Relation Age of Onset  . Dementia Mother   . Alzheimer's disease Mother   . Dementia Father     SOCIAL HISTORY: Social History   Socioeconomic History  . Marital status: Married    Spouse name: Not on file  . Number of children: Not on file  . Years of education: Not on file  . Highest education level: Not on file  Occupational History  . Not on file  Social Needs  . Financial resource strain: Not on file  . Food insecurity:    Worry: Not on file    Inability: Not on file  . Transportation needs:    Medical: Not on file    Non-medical: Not on file  Tobacco Use  . Smoking status: Former Smoker    Last attempt to quit: 08/30/1992    Years since quitting: 25.5  . Smokeless tobacco: Former Network engineer and Sexual Activity  . Alcohol use: Yes    Comment: occ  . Drug use: No  . Sexual activity: Not on file    Comment: still smokes occ cigar  Lifestyle  . Physical activity:    Days per week: Not on file    Minutes per session: Not on file  . Stress: Not on file  Relationships  . Social connections:    Talks on phone: Not on file    Gets together: Not on file     Attends religious service: Not on file    Active member of club or organization: Not on file    Attends meetings of clubs or organizations: Not on file    Relationship status: Not on file  . Intimate partner violence:    Fear of current or ex partner: Not on file    Emotionally abused: Not on file    Physically abused: Not on file    Forced sexual activity: Not on file  Other Topics Concern  . Not on file  Social History Narrative  . Not on file      PHYSICAL EXAM  Vitals:   03/09/18 0816  BP: (!) 123/59  Pulse: 78  Weight: 230 lb 12.8 oz (104.7 kg)  Height: _0  (1.88 m)   Body mass index is 29.63 kg/m.  Generalized: Well developed, in no acute distress   Neurological examination  Mentation: Alert oriented to time, place, history taking. Follows all commands speech and language fluent Cranial nerve II-XII: Pupils were equal round reactive to light. Extraocular movements were full, visual field were full on confrontational test. Facial sensation and strength were normal. Uvula tongue midline. Head turning and shoulder shrug  were normal and symmetric. Motor: The motor testing reveals 5 over 5 strength of all 4 extremities. Good symmetric motor tone is noted throughout.  Sensory: Sensory testing is intact to soft touch on all 4 extremities. No evidence of extinction is noted.  Coordination: Cerebellar testing reveals good finger-nose-finger and heel-to-shin bilaterally.  Gait and station: Gait is normal. Tandem gait is normal. Romberg is negative. No drift is seen.  Reflexes: Deep tendon reflexes are symmetric and normal bilaterally.   DIAGNOSTIC DATA (LABS, IMAGING, TESTING) - I reviewed patient records, labs, notes, testing and imaging myself where available.  Lab Results  Component Value Date   HGB 14.2 09/05/2012    Lab Results  Component Value Date   VITAMINB12 350 04/01/2017      ASSESSMENT AND PLAN 77 y.o. year old male  has a past medical history of  Arthritis, Cancer ([place]), Diabetes mellitus without complication ([place]), Heart murmur, Memory disorder (04/01/2017), Neuropathy, and Wears glasses. here with:  1.  Memory disturbance 2. Fatigue 3. b12 deficiency  The patient's memory score has remained stable.  He will continue on Aricept 10 mg at bedtime.  There is been concern that the patient has a significant amount of fatigue throughout the day.  Unsure if this is related to possible depression?  I will check blood work today to rule out any reversible causes of fatigue.  If blood work is normal he should see his primary care to discuss further.  Patient and his wife voiced understanding.  He will follow-up in 6 months or sooner if needed.      Ward Givens, MSN, NP-C 03/09/2018, 8:45 AM [facility] [address] [place], [place] [address] [phone]

## 2018-03-09 NOTE — Progress Notes (Signed)
I have read the note, and I agree with the clinical assessment and plan.  Charles K Willis   

## 2018-03-10 ENCOUNTER — Telehealth: Payer: Self-pay | Admitting: *Deleted

## 2018-03-10 LAB — CBC WITH DIFFERENTIAL/PLATELET
Basophils Absolute: 0 10*3/uL (ref 0.0–0.2)
Basos: 1 %
EOS (ABSOLUTE): 0.1 10*3/uL (ref 0.0–0.4)
EOS: 1 %
HEMATOCRIT: 44.9 % (ref 37.5–51.0)
Hemoglobin: 14.8 g/dL (ref 13.0–17.7)
Immature Grans (Abs): 0 10*3/uL (ref 0.0–0.1)
Immature Granulocytes: 0 %
LYMPHS ABS: 1.3 10*3/uL (ref 0.7–3.1)
Lymphs: 22 %
MCH: 30.1 pg (ref 26.6–33.0)
MCHC: 33 g/dL (ref 31.5–35.7)
MCV: 91 fL (ref 79–97)
Monocytes Absolute: 0.5 10*3/uL (ref 0.1–0.9)
Monocytes: 8 %
NEUTROS ABS: 3.9 10*3/uL (ref 1.4–7.0)
Neutrophils: 68 %
Platelets: 230 10*3/uL (ref 150–450)
RBC: 4.91 x10E6/uL (ref 4.14–5.80)
RDW: 13.4 % (ref 12.3–15.4)
WBC: 5.8 10*3/uL (ref 3.4–10.8)

## 2018-03-10 LAB — COMPREHENSIVE METABOLIC PANEL
A/G RATIO: 1.7 (ref 1.2–2.2)
ALBUMIN: 4 g/dL (ref 3.5–4.8)
ALT: 18 IU/L (ref 0–44)
AST: 14 IU/L (ref 0–40)
Alkaline Phosphatase: 109 IU/L (ref 39–117)
BILIRUBIN TOTAL: 0.5 mg/dL (ref 0.0–1.2)
BUN / CREAT RATIO: 15 (ref 10–24)
BUN: 15 mg/dL (ref 8–27)
CALCIUM: 10 mg/dL (ref 8.6–10.2)
CO2: 22 mmol/L (ref 20–29)
CREATININE: 1 mg/dL (ref 0.76–1.27)
Chloride: 99 mmol/L (ref 96–106)
GFR, EST AFRICAN AMERICAN: 85 mL/min/{1.73_m2} (ref >59)
GFR, EST NON AFRICAN AMERICAN: 73 mL/min/{1.73_m2} (ref >59)
Globulin, Total: 2.3 g/dL (ref 1.5–4.5)
Glucose: 329 mg/dL — ABNORMAL HIGH (ref 65–99)
Potassium: 5 mmol/L (ref 3.5–5.2)
Sodium: 140 mmol/L (ref 134–144)
TOTAL PROTEIN: 6.3 g/dL (ref 6.0–8.5)

## 2018-03-10 LAB — VITAMIN D 25 HYDROXY (VIT D DEFICIENCY, FRACTURES): Vit D, 25-Hydroxy: 39.6 ng/mL (ref 30.0–100.0)

## 2018-03-10 LAB — VITAMIN B12: Vitamin B-12: 534 pg/mL (ref 232–1245)

## 2018-03-10 LAB — TSH: TSH: 1.85 u[IU]/mL (ref 0.450–4.500)

## 2018-03-10 NOTE — Telephone Encounter (Signed)
Spoke with patient and informed him that his labs are okay with the exception of elevated glucose. He stated he was aware of that, verbalized understanding, appreciation.

## 2018-11-21 ENCOUNTER — Telehealth: Payer: Self-pay | Admitting: *Deleted

## 2018-11-21 NOTE — Telephone Encounter (Signed)
Due to current COVID 19 pandemic, our office is severely reducing in office visits until further notice, in order to minimize the risk to our patients and healthcare providers.  Pt understands that although there may be some limitations with this type of visit, we will take all precautions to reduce any security or privacy concerns.  Pt understands that this will be treated like an in office visit and we will file with pt's insurance, and there may be a patient responsible charge related to this service.  They cannot do video visit.  Consented to telephone.  She stated call her cell [phone], so she will be on speaker as well.   Relayed that is worse re; memory. Is getting b12 injections now per pcp.

## 2018-11-23 ENCOUNTER — Other Ambulatory Visit: Payer: Self-pay

## 2018-11-23 ENCOUNTER — Ambulatory Visit: Payer: Medicare HMO | Admitting: Adult Health

## 2018-11-23 ENCOUNTER — Encounter: Payer: Self-pay | Admitting: Neurology

## 2018-11-23 ENCOUNTER — Ambulatory Visit (INDEPENDENT_AMBULATORY_CARE_PROVIDER_SITE_OTHER): Payer: Medicare HMO | Admitting: Neurology

## 2018-11-23 ENCOUNTER — Telehealth: Payer: Self-pay | Admitting: Neurology

## 2018-11-23 DIAGNOSIS — R413 Other amnesia: Secondary | ICD-10-CM

## 2018-11-23 MED ORDER — MEMANTINE HCL 5 MG PO TABS: ORAL_TABLET | ORAL | 0 refills | Status: DC

## 2018-11-23 MED ORDER — DONEPEZIL HCL 10 MG PO TABS: 10.0000 mg | ORAL_TABLET | Freq: Every day | ORAL | 3 refills | Status: DC

## 2018-11-23 NOTE — Progress Notes (Signed)
Virtual Visit via Telephone Note  I connected with Joseph Lawson on 11/23/18 at  9:45 AM EDT by telephone and verified that I am speaking with the correct person using two identifiers.   I discussed the limitations, risks, security and privacy concerns of performing an evaluation and management service by telephone and the availability of in person appointments. I also discussed with the patient that there may be a patient responsible charge related to this service. The patient expressed understanding and agreed to proceed.  History of Present Illness: 11/23/2018 SS: Joseph Lawson is a 78 year old male with history of progressive memory disturbance.  He is currently taking Aricept 10 mg.  He indicates no changes in his memory, however his wife indicates that his memory has worsened since last visit.  She reports he is very forgetful, he thinks he has done things when he has not.  He is not very active during the day, sits and watches TV.  He recently had a low B12 level, is undergoing injections, managed by his PCP.  His wife manages his medications, which is new over the last year.  She continues to manage their finances.  He is able to perform his ADLs, he reports he has a good appetite.  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  His wife privately wonders how well this is going.  He is able to drive a car without problem.  They both deny any underlying depression.   03/09/2018 MM: Joseph Lawson is a 78 year old male with a history of progressive memory disturbance.  He returns today for follow-up.  At the last visit Aricept was increased to 10 mg at bedtime.  He feels that his memory has remained the same.  He is able to complete all ADLs independently.  He does operate a Teacher, music.  Wife reports on occasion he has forgotten how to get to familiar places.  He reports that his sleep is okay.  Denies any changes in mood or behavior.  Although in private his wife reports that she is concerned that he  may be depressed.  She states that he has become very reclusive.  Reports that he has no energy to complete task.  Patient denies hallucinations.  Reports good appetite.  He returns today for evaluation   Observations/Objective: Telephone call, is alert and oriented, answers questions appropriately, fair historian Moca-blind 17/22   Assessment and Plan: 1.  Memory disturbance  His memory score on Moca-blind was 17/22, at his last visit was 24/30.  His wife reports she has noticed a decline in his memory over the last several months.  For this reason, we will start Namenda, slowly increasing over 4 weeks to 10 mg twice daily.  We discussed side effects of medication. He will continue taking Aricept.  They will continue to work with primary care to manage fatigue issues.  I encouraged him to incorporate physical activity, healthy eating, drinking plenty of water, brain stimulating exercises to promote memory.  They will follow-up in about 5 months.   Once at dose of Namenda 10 mg twice daily, they will call for a new prescription to be sent in.   Follow Up Instructions: 5 months 04/26/2019 9:15 am   I discussed the assessment and treatment plan with the patient. The patient was provided an opportunity to ask questions and all were answered. The patient agreed with the plan and demonstrated an understanding of the instructions.   The patient was advised to call back  or seek an in-person evaluation if the symptoms worsen or if the condition fails to improve as anticipated.  I provided 30 minutes of non-face-to-face time during this encounter.   Evangeline Dakin, DNP  [facility] [address], [place],  [address] [phone]

## 2018-11-23 NOTE — Progress Notes (Signed)
I have read the note, and I agree with the clinical assessment and plan.  Charles K Willis   

## 2018-11-23 NOTE — Telephone Encounter (Signed)
Pts insurance is only covering a max of 2 tabs per day for memantine (NAMENDA) 5 MG tablet and , the pharmacy stated directions are unclear and they need clarifications  CB# [id number]

## 2018-11-23 NOTE — Progress Notes (Signed)
Fax confirmation received [facility] [phone].

## 2018-11-23 NOTE — Telephone Encounter (Signed)
I called spoke to India Hook, rx tech at [facility].  Pt has deductible to meet, and cost expensive even for generic.  Using goodrx (for titration pack, then maintenance).  Was able to use prescription provided (split).  Not needed anything more from [place] at this time.

## 2019-01-25 ENCOUNTER — Other Ambulatory Visit: Payer: Self-pay | Admitting: *Deleted

## 2019-01-25 MED ORDER — MEMANTINE HCL 10 MG PO TABS: 10.0000 mg | ORAL_TABLET | Freq: Two times a day (BID) | ORAL | 3 refills | Status: DC

## 2019-04-26 ENCOUNTER — Ambulatory Visit: Payer: Medicare HMO | Admitting: Neurology

## 2019-05-17 ENCOUNTER — Other Ambulatory Visit: Payer: Self-pay | Admitting: Neurology

## 2019-05-17 NOTE — Progress Notes (Signed)
PATIENT: Joseph Lawson DOB: [birth year]  REASON FOR VISIT: follow up HISTORY FROM: patient  HISTORY OF PRESENT ILLNESS: Today 05/18/19  Joseph Lawson is a 78 year old male with history of a progressive memory disturbance.  He remains on Aricept 10 mg at bedtime, and Namenda.  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  He says his memory is doing fine.  His wife is concerned at his inactivity, he sits and watches TV for sometimes 12 hours a day.  She is not sure if he is actually working or not, because she tracks his phone, and notices that he drives around in circles.  He will leave for work, but will only be gone for a few hours.  There was one time he had a hallucination, thought somebody was in the bathroom.  He eats constantly.  He wants to go with his wife everywhere she goes.  He has had some urinary accidents, is now wearing depends.  He used to enjoy fishing, and working in the yard.  He no longer has any interest in doing activities he used to enjoy.  He has not had any falls.  His wife lays out his medications, and he takes them.  He drives a car.  He has not had any accidents, but he will sometimes be late to meet his wife because he will make the wrong turn.  His wife handles the finances, as she always has.  He is paid based on commission, says he has not been paid recently. Since September 2019, he has gained almost 30 pounds.  He sleeps well at night, goes to bed around 11, until 8:00 am. He presents today for evaluation accompanied by his wife.  HISTORY  11/23/2018 SS: Mr. [name] is a 79 year old male with history of progressive memory disturbance.  He is currently taking Aricept 10 mg.  He indicates no changes in his memory, however his wife indicates that his memory has worsened since last visit.  She reports he is very forgetful, he thinks he has done things when he has not.  He is not very active during the day, sits and watches TV.  He recently  had a low B12 level, is undergoing injections, managed by his PCP.  His wife manages his medications, which is new over the last year.  She continues to manage their finances.  He is able to perform his ADLs, he reports he has a good appetite.  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  His wife privately wonders how well this is going.  He is able to drive a car without problem.  They both deny any underlying depression.    REVIEW OF SYSTEMS: Out of a complete 14 system review of symptoms, the patient complains only of the following symptoms, and all other reviewed systems are negative.  Memory loss  ALLERGIES: No Known Allergies  HOME MEDICATIONS: Outpatient Medications Prior to Visit  Medication Sig Dispense Refill   aspirin 81 MG tablet Take 81 mg by mouth daily.     Cyanocobalamin (B-12 PO) Take 1 tablet by mouth daily.     donepezil (ARICEPT) 10 MG tablet Take 1 tablet (10 mg total) by mouth at bedtime. 90 tablet 3   glipiZIDE (GLUCOTROL) 5 MG tablet Take 5 mg by mouth 2 (two) times daily before a meal.  3   JENTADUETO 2.10-998 MG TABS Take 2 tablets by mouth 2 (two) times daily.   3  lisinopril (PRINIVIL,ZESTRIL) 5 MG tablet TAKE 1 TABLET BY MOUTH EVERY DAY     memantine (NAMENDA) 10 MG tablet Take 1 tablet (10 mg total) by mouth 2 (two) times daily. 60 tablet 3   terbinafine (LAMISIL) 250 MG tablet      No facility-administered medications prior to visit.     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   Wears glasses     PAST SURGICAL HISTORY: Past Surgical History:  Procedure Laterality Date   BACK SURGERY  1995   lumb lam   COLONOSCOPY     DENTAL RESTORATION/EXTRACTION WITH X-RAY     KNEE ARTHROSCOPY  2012   right   SALIVARY GLAND SURGERY  1980     right neck   SHOULDER OPEN ROTATOR CUFF REPAIR Right 09/05/2012   Procedure: RIGHT SHOULDER ROTATOR CUFF REPAIR WITH DISTAL CLAVICLE EXCISION/BICEPS TENOTOMY;  Surgeon: Jolyn Nap, MD;  Location: [facility];  Service: Orthopedics;  Laterality: Right;    FAMILY HISTORY: Family History  Problem Relation Age of Onset   Dementia Mother    Alzheimer's disease Mother    Dementia Father     SOCIAL HISTORY: Social History   Socioeconomic History   Marital status: Married    Spouse name: Not on file   Number of children: Not on file   Years of education: Not on file   Highest education level: Not on file  Occupational History   Not on file  Social Needs   Financial resource strain: Not on file   Food insecurity    Worry: Not on file    Inability: Not on file   Transportation needs    Medical: Not on file    Non-medical: Not on file  Tobacco Use   Smoking status: Former Smoker    Quit date: 08/30/1992    Years since quitting: 26.7   Smokeless tobacco: Former Network engineer and Sexual Activity   Alcohol use: Yes    Comment: occ   Drug use: No   Sexual activity: Not on file    Comment: still smokes occ cigar  Lifestyle   Physical activity    Days per week: Not on file    Minutes per session: Not on file   Stress: Not on file  Relationships   Social connections    Talks on phone: Not on file    Gets together: Not on file    Attends religious service: Not on file    Active member of club or organization: Not on file    Attends meetings of clubs or organizations: Not on file    Relationship status: Not on file   Intimate partner violence    Fear of current or ex partner: Not on file    Emotionally abused: Not on file    Physically abused: Not on file    Forced sexual activity: Not on file  Other Topics Concern   Not on file  Social History Narrative   Not on file    PHYSICAL EXAM  Vitals:   05/18/19 0848  BP: 138/70   Pulse: 84  Temp: 97.7 F (36.5 C)  TempSrc: Oral  Weight: 258 lb 12.8 oz (117.4 kg)  Height: 6\' 3"  (1.905 m)  Body mass index is 32.35 kg/m.  Generalized: Well developed, in no acute distress  MMSE - Mini Mental State Exam 05/18/2019 03/09/2018 09/30/2017  Not completed: (No Data) - -  Orientation to time 4 4 2   Orientation to Place 4 5 5   Registration 3 3 3   Attention/ Calculation 5 5 5   Recall 1 2 0  Language- name 2 objects 2 2 2   Language- repeat 1 0 1  Language- follow 3 step command 2 3 3   Language- follow 3 step command-comments he took the paper in his left hand - -  Language- read & follow direction 1 1 1   Write a sentence 1 1 1   Copy design 1 1 1   Total score 25 27 24     Neurological examination  Mentation: Alert oriented to time, place, history taking. Follows all commands speech and language fluent Cranial nerve II-XII: Pupils were equal round reactive to light. Extraocular movements were full, visual field were full on confrontational test. Facial sensation and strength were normal.  Head turning and shoulder shrug  were normal and symmetric. Motor: The motor testing reveals 5 over 5 strength of all 4 extremities. Good symmetric motor tone is noted throughout.  Sensory: Sensory testing is intact to soft touch on all 4 extremities. No evidence of extinction is noted.  Coordination: Cerebellar testing reveals good finger-nose-finger and heel-to-shin bilaterally.  Gait and station: Gait is normal. Tandem gait is normal.  Reflexes: Deep tendon reflexes are symmetric and normal bilaterally.   DIAGNOSTIC DATA (LABS, IMAGING, TESTING) - I reviewed patient records, labs, notes, testing and imaging myself where available.  Lab Results  Component Value Date   WBC 5.8 03/09/2018   HGB 14.8 03/09/2018   HCT 44.9 03/09/2018   MCV 91 03/09/2018   PLT 230 03/09/2018      Component Value Date/Time   NA 140 03/09/2018 1039   K 5.0 03/09/2018 1039   CL 99 03/09/2018  1039   CO2 22 03/09/2018 1039   GLUCOSE 329 (H) 03/09/2018 1039   BUN 15 03/09/2018 1039   CREATININE 1.00 03/09/2018 1039   CALCIUM 10.0 03/09/2018 1039   PROT 6.3 03/09/2018 1039   ALBUMIN 4.0 03/09/2018 1039   AST 14 03/09/2018 1039   ALT 18 03/09/2018 1039   ALKPHOS 109 03/09/2018 1039   BILITOT 0.5 03/09/2018 1039   GFRNONAA 73 03/09/2018 1039   GFRAA 85 03/09/2018 1039   No results found for: CHOL, HDL, LDLCALC, LDLDIRECT, TRIG, CHOLHDL No results found for: HGBA1C Lab Results  Component Value Date   [id number] 03/09/2018   Lab Results  Component Value Date   TSH 1.850 03/09/2018    ASSESSMENT AND PLAN 78 y.o. year old male  has a past medical history of Arthritis, Cancer ([place]), Diabetes mellitus without complication ([place]), Heart murmur, Memory disorder (04/01/2017), Neuropathy, and Wears glasses. here with:  1.  Memory disturbance  He is exhibiting some signs of underlying depression, and his wife is concerned for this.  I will start him on Zoloft 25 mg daily.  He will remain on Aricept and Namenda.  His wife will closely monitor his driving, and check into his work status to see if he is in fact still employed.  He will follow-up in 6 months or sooner if needed.  I did advise if his symptoms worsen or if he develops any new symptoms he should let us know.  I spent 15 minutes with the patient. 50% of this time was spent  discussing his plan of care.  Butler Denmark, AGNP-C, DNP 05/18/2019, 8:52 AM [facility] [address], [place], [place] [address] [phone]

## 2019-05-18 ENCOUNTER — Other Ambulatory Visit: Payer: Self-pay

## 2019-05-18 ENCOUNTER — Encounter: Payer: Self-pay | Admitting: Neurology

## 2019-05-18 ENCOUNTER — Ambulatory Visit: Payer: Medicare HMO | Admitting: Neurology

## 2019-05-18 VITALS — BP 138/70 | HR 84 | Temp 97.7°F | Ht 75.0 in | Wt 258.8 lb

## 2019-05-18 DIAGNOSIS — R413 Other amnesia: Secondary | ICD-10-CM

## 2019-05-18 MED ORDER — SERTRALINE HCL 25 MG PO TABS: 25.0000 mg | ORAL_TABLET | Freq: Every day | ORAL | 5 refills | Status: DC

## 2019-05-18 NOTE — Patient Instructions (Signed)
Start Zoloft 25 mg daily   Continue Aricept and Namenda   Return in 6 months   Merry Christmas!   Sertraline tablets What is this medicine? SERTRALINE (SER tra leen) is used to treat depression. It may also be used to treat obsessive compulsive disorder, panic disorder, post-trauma stress, premenstrual dysphoric disorder (PMDD) or social anxiety. This medicine may be used for other purposes; ask your health care provider or pharmacist if you have questions. COMMON BRAND NAME(S): Zoloft What should I tell my health care provider before I take this medicine? They need to know if you have any of these conditions:  bleeding disorders  bipolar disorder or a family history of bipolar disorder  glaucoma  heart disease  high blood pressure  history of irregular heartbeat  history of low levels of calcium, magnesium, or potassium in the blood  if you often drink alcohol  liver disease  receiving electroconvulsive therapy  seizures  suicidal thoughts, plans, or attempt; a previous suicide attempt by you or a family member  take medicines that treat or prevent blood clots  thyroid disease  an unusual or allergic reaction to sertraline, other medicines, foods, dyes, or preservatives  pregnant or trying to get pregnant  breast-feeding How should I use this medicine? Take this medicine by mouth with a glass of water. Follow the directions on the prescription label. You can take it with or without food. Take your medicine at regular intervals. Do not take your medicine more often than directed. Do not stop taking this medicine suddenly except upon the advice of your doctor. Stopping this medicine too quickly may cause serious side effects or your condition may worsen. A special MedGuide will be given to you by the pharmacist with each prescription and refill. Be sure to read this information carefully each time. Talk to your pediatrician regarding the use of this medicine in  children. While this drug may be prescribed for children as young as 7 years for selected conditions, precautions do apply. Overdosage: If you think you have taken too much of this medicine contact a poison control center or emergency room at once. NOTE: This medicine is only for you. Do not share this medicine with others. What if I miss a dose? If you miss a dose, take it as soon as you can. If it is almost time for your next dose, take only that dose. Do not take double or extra doses. What may interact with this medicine? Do not take this medicine with any of the following medications:  cisapride  dronedarone  linezolid  MAOIs like Carbex, Eldepryl, Marplan, Nardil, and Parnate  methylene blue (injected into a vein)  pimozide  thioridazine This medicine may also interact with the following medications:  alcohol  amphetamines  aspirin and aspirin-like medicines  certain medicines for depression, anxiety, or psychotic disturbances  certain medicines for fungal infections like ketoconazole, fluconazole, posaconazole, and itraconazole  certain medicines for irregular heart beat like flecainide, quinidine, propafenone  certain medicines for migraine headaches like almotriptan, eletriptan, frovatriptan, naratriptan, rizatriptan, sumatriptan, zolmitriptan  certain medicines for sleep  certain medicines for seizures like carbamazepine, valproic acid, phenytoin  certain medicines that treat or prevent blood clots like warfarin, enoxaparin, dalteparin  cimetidine  digoxin  diuretics  fentanyl  isoniazid  lithium  NSAIDs, medicines for pain and inflammation, like ibuprofen or naproxen  other medicines that prolong the QT interval (cause an abnormal heart rhythm) like dofetilide  rasagiline  safinamide  supplements like St. John's  wort, kava kava, valerian  tolbutamide  tramadol  tryptophan This list may not describe all possible interactions. Give your  health care provider a list of all the medicines, herbs, non-prescription drugs, or dietary supplements you use. Also tell them if you smoke, drink alcohol, or use illegal drugs. Some items may interact with your medicine. What should I watch for while using this medicine? Tell your doctor if your symptoms do not get better or if they get worse. Visit your doctor or health care professional for regular checks on your progress. Because it may take several weeks to see the full effects of this medicine, it is important to continue your treatment as prescribed by your doctor. Patients and their families should watch out for new or worsening thoughts of suicide or depression. Also watch out for sudden changes in feelings such as feeling anxious, agitated, panicky, irritable, hostile, aggressive, impulsive, severely restless, overly excited and hyperactive, or not being able to sleep. If this happens, especially at the beginning of treatment or after a change in dose, call your health care professional. Dennis Bast may get drowsy or dizzy. Do not drive, use machinery, or do anything that needs mental alertness until you know how this medicine affects you. Do not stand or sit up quickly, especially if you are an older patient. This reduces the risk of dizzy or fainting spells. Alcohol may interfere with the effect of this medicine. Avoid alcoholic drinks. Your mouth may get dry. Chewing sugarless gum or sucking hard candy, and drinking plenty of water may help. Contact your doctor if the problem does not go away or is severe. What side effects may I notice from receiving this medicine? Side effects that you should report to your doctor or health care professional as soon as possible:  allergic reactions like skin rash, itching or hives, swelling of the face, lips, or tongue  anxious  black, tarry stools  changes in vision  confusion  elevated mood, decreased need for sleep, racing thoughts, impulsive  behavior  eye pain  fast, irregular heartbeat  feeling faint or lightheaded, falls  feeling agitated, angry, or irritable  hallucination, loss of contact with reality  loss of balance or coordination  loss of memory  painful or prolonged erections  restlessness, pacing, inability to keep still  seizures  stiff muscles  suicidal thoughts or other mood changes  trouble sleeping  unusual bleeding or bruising  unusually weak or tired  vomiting Side effects that usually do not require medical attention (report to your doctor or health care professional if they continue or are bothersome):  change in appetite or weight  change in sex drive or performance  diarrhea  increased sweating  indigestion, nausea  tremors This list may not describe all possible side effects. Call your doctor for medical advice about side effects. You may report side effects to FDA at 1-800-FDA-1088. Where should I keep my medicine? Keep out of the reach of children. Store at room temperature between 15 and 30 degrees C (59 and 86 degrees F). Throw away any unused medicine after the expiration date. NOTE: This sheet is a summary. It may not cover all possible information. If you have questions about this medicine, talk to your doctor, pharmacist, or health care provider.  2020 Elsevier/Gold Standard (2018-05-24 10:09:27)

## 2019-05-20 NOTE — Progress Notes (Signed)
I have read the note, and I agree with the clinical assessment and plan.  Candra Wegner K Karver Fadden   

## 2019-08-14 ENCOUNTER — Telehealth: Payer: Self-pay | Admitting: Neurology

## 2019-08-14 NOTE — Telephone Encounter (Signed)
Donlon,Carole(wife on DPR)Pt wife states pt's memory is declining.  There is a lot of incontinence and she feels a MRI would be more to her benefit so that she can know what is going on.  Please call

## 2019-08-14 NOTE — Telephone Encounter (Signed)
I called and spoke to wife.  Pt has worsened in memory/behaviors and wife is requesting MRI to check and see how much he has deterioted.  She relates his ST memory is very bad (on and off) even with medications and is having incontinence 3 times last week. Has some flushing in cheeks, nose (not febrile she states).  He went driving out to [place] when she left him for a short bit.  She was concerned as he has driven short distance close to home.  I relayed that behavior changes/ confusion can be metabolic changes, (glucose blood sugars, UTI).  I stated to see pcp, she has appt 09-07-19.  I offered appt , but she did not want appt as has one with pcp at end of month.  Her main question is MRI. Please advise.

## 2019-08-14 NOTE — Telephone Encounter (Signed)
He had MRI of the brain in 2018, that showed generalized cortical atrophy, consistent with memory issues.  I do not think MRI of the brain is necessary at this time, if this is a progression of memory issues or deterioration. Aricept could result in urinary incontinence 1-3% of patients but has been on for awhile, he may have UTI, may check urine sample with PCP? When last seen his MMSE was 25/30, which is pretty good. Would suggest seeing PCP r/o infection. New weakness, headache, speech difficulty, facial droop, balance, or gait change could be indicators for MRI.   MRI of the brain 04/12/2017 IMPRESSION:  This MRI of the brain without contrast shows the following: 1.    Moderate generalized cortical atrophy. 2.    Minimal age-appropriate chronic microvascular ischemic change. 3.    There are no acute findings.

## 2019-08-15 NOTE — Telephone Encounter (Signed)
I called wife back and relayed that per SS/NP that with increased incontinence to see pcp for evaluation for UTI or other infections.  She noted that she failed to mention yesterday that pt used to have head tremor this has passed but now has R arm and hand tremor,(some in L but not as much).  Will monitor, wanted to mention.

## 2019-10-07 ENCOUNTER — Other Ambulatory Visit: Payer: Self-pay | Admitting: Neurology

## 2019-11-08 ENCOUNTER — Ambulatory Visit: Payer: Medicare HMO | Admitting: Neurology

## 2019-11-15 ENCOUNTER — Telehealth: Payer: Self-pay | Admitting: Neurology

## 2019-11-15 NOTE — Telephone Encounter (Signed)
Pt wife called to verify if pt should still be taking the lisinopril as she did not see it on his medication list

## 2019-11-15 NOTE — Telephone Encounter (Signed)
I called pts wife and she will call pcp about the lisinopril, confirmed appt for pt 12/13/19 at [facility].  She verbalized understanding.

## 2019-11-16 ENCOUNTER — Ambulatory Visit: Payer: Medicare HMO | Admitting: Neurology

## 2019-11-20 ENCOUNTER — Other Ambulatory Visit: Payer: Self-pay | Admitting: Neurology

## 2019-12-13 ENCOUNTER — Ambulatory Visit: Payer: Medicare HMO | Admitting: Neurology

## 2019-12-14 ENCOUNTER — Ambulatory Visit: Payer: Medicare HMO | Admitting: Neurology

## 2019-12-19 ENCOUNTER — Other Ambulatory Visit: Payer: Self-pay | Admitting: Neurology

## 2020-02-20 ENCOUNTER — Encounter: Payer: Self-pay | Admitting: Neurology

## 2020-02-20 ENCOUNTER — Ambulatory Visit: Payer: Medicare Other | Admitting: Neurology

## 2020-02-20 ENCOUNTER — Telehealth: Payer: Self-pay | Admitting: Neurology

## 2020-02-20 VITALS — BP 157/68 | HR 87 | Ht 75.0 in | Wt 268.0 lb

## 2020-02-20 DIAGNOSIS — R453 Demoralization and apathy: Secondary | ICD-10-CM

## 2020-02-20 DIAGNOSIS — R413 Other amnesia: Secondary | ICD-10-CM

## 2020-02-20 MED ORDER — MEMANTINE HCL 10 MG PO TABS: 10.0000 mg | ORAL_TABLET | Freq: Two times a day (BID) | ORAL | 4 refills | Status: DC

## 2020-02-20 NOTE — Progress Notes (Signed)
PATIENT: Joseph Lawson DOB: 24-Jul-1940  REASON FOR VISIT: follow up HISTORY FROM: patient  HISTORY OF PRESENT ILLNESS: Today 02/20/20 [name] is a 79 year old male with history of progressive memory disturbance.  He is on Aricept and Namenda. Has previously had negative sleep study.  Continues to have no interest in activity, sits around watching TV all day, " I do feel like doing anything", for no particular reason.  Was on Zoloft, and PCP switched to Effexor, no change.  Is no longer working, but he says he is.  Does not remember the day, no longer driving, wife sets out medications, diabetes not well controlled, average glucose over 200, A1c 7.9, increased appetite, eats a lot of meals, does not help wife with household chores or yard work, has urinary accidents, sits too long then it is too late, has seen urology for this, has poor hygiene.  He sleeps well, is able to pick out his own clothes, bathe. His wife is frustrated about his decline, and lack of interest in activity.  No falls. Memory 25/30 today. Wants to talk about getting MRI. Presents today accompanied by his wife.  HISTORY 05/18/2019 SS: [name] is a 79 year old male with history of a progressive memory disturbance.  He remains on Aricept 10 mg at bedtime, and Namenda.  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  He says his memory is doing fine.  His wife is concerned at his inactivity, he sits and watches TV for sometimes 12 hours a day.  She is not sure if he is actually working or not, because she tracks his phone, and notices that he drives around in circles.  He will leave for work, but will only be gone for a few hours.  There was one time he had a hallucination, thought somebody was in the bathroom.  He eats constantly.  He wants to go with his wife everywhere she goes.  He has had some urinary accidents, is now wearing depends.  He used to enjoy fishing, and working in the yard.  He  no longer has any interest in doing activities he used to enjoy.  He has not had any falls.  His wife lays out his medications, and he takes them.  He drives a car.  He has not had any accidents, but he will sometimes be late to meet his wife because he will make the wrong turn.  His wife handles the finances, as she always has.  He is paid based on commission, says he has not been paid recently. Since September 2019, he has gained almost 30 pounds.  He sleeps well at night, goes to bed around 11, until 8:00 am. He presents today for evaluation accompanied by his wife.   REVIEW OF SYSTEMS: Out of a complete 14 system review of symptoms, the patient complains only of the following symptoms, and all other reviewed systems are negative.  Memory loss  ALLERGIES: No Known Allergies  HOME MEDICATIONS: Outpatient Medications Prior to Visit  Medication Sig Dispense Refill   aspirin 81 MG tablet Take 81 mg by mouth daily.     Cyanocobalamin (B-12 PO) Take 1 tablet by mouth daily.     donepezil (ARICEPT) 10 MG tablet TAKE 1 TABLET BY MOUTH AT BEDTIME 90 tablet 3   glipiZIDE (GLUCOTROL) 5 MG tablet Take 5 mg by mouth 2 (two) times daily before a meal.  3   insulin glargine (LANTUS) 100 UNIT/ML injection  Inject into the skin daily. 25 units daily     lisinopril (PRINIVIL,ZESTRIL) 5 MG tablet TAKE 1 TABLET BY MOUTH EVERY DAY     memantine (NAMENDA) 10 MG tablet TAKE 1 TABLET BY MOUTH TWICE DAILY 180 tablet 0   JENTADUETO 2.10-998 MG TABS Take 2 tablets by mouth 2 (two) times daily.   3   sertraline (ZOLOFT) 25 MG tablet TAKE 1 TABLET BY MOUTH EVERY DAY 30 tablet 5   terbinafine (LAMISIL) 250 MG tablet      No facility-administered medications prior to visit.    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   Wears glasses     PAST SURGICAL HISTORY: Past Surgical History:  Procedure Laterality Date   BACK SURGERY  1995   lumb lam   COLONOSCOPY     DENTAL RESTORATION/EXTRACTION WITH X-RAY     KNEE ARTHROSCOPY  2012   right   SALIVARY GLAND SURGERY  1980   right neck   SHOULDER OPEN ROTATOR CUFF REPAIR Right 09/05/2012   Procedure: RIGHT SHOULDER ROTATOR CUFF REPAIR WITH DISTAL CLAVICLE EXCISION/BICEPS TENOTOMY;  Surgeon: Jolyn Nap, MD;  Location: [facility];  Service: Orthopedics;  Laterality: Right;    FAMILY HISTORY: Family History  Problem Relation Age of Onset   Dementia Mother    Alzheimer's disease Mother    Dementia Father     SOCIAL HISTORY: Social History   Socioeconomic History   Marital status: Married    Spouse name: Not on file   Number of children: Not on file   Years of education: Not on file   Highest education level: Not on file  Occupational History   Not on file  Tobacco Use   Smoking status: Former Smoker    Quit date: 08/30/1992    Years since quitting: 27.4   Smokeless tobacco: Former Network engineer and Sexual Activity   Alcohol use: Yes    Comment: occ   Drug use: No   Sexual activity: Not on file    Comment: still smokes occ cigar  Other Topics Concern   Not on file  Social History Narrative   Not on file   Social Determinants of Health   Financial Resource Strain:    Difficulty of Paying Living Expenses: Not on file  Food Insecurity:    Worried About Charity fundraiser in the Last Year: Not on file   [place] in the Last Year: Not on file  Transportation Needs:    Lack of Transportation (Medical): Not on file   Lack of Transportation (Non-Medical): Not on file  Physical Activity:    Days of Exercise per Week: Not on file   Minutes of Exercise per Session: Not on file  Stress:    Feeling of Stress : Not on file  Social Connections:    Frequency of  Communication with Friends and Family: Not on file   Frequency of Social Gatherings with Friends and Family: Not on file   Attends Religious Services: Not on file   Active Member of Clubs or Organizations: Not on file   Attends Archivist Meetings: Not on file   Marital Status: Not on file  Intimate Partner Violence:    Fear  of Current or Ex-Partner: Not on file   Emotionally Abused: Not on file   Physically Abused: Not on file   Sexually Abused: Not on file   PHYSICAL EXAM  Vitals:   02/20/20 0941  BP: (!) 157/68  Pulse: 87  Weight: 268 lb (121.6 kg)  Height: 6\' 3"  (1.905 m)   Body mass index is 33.5 kg/m.  Generalized: Well developed, in no acute distress  MMSE - Mini Mental State Exam 02/20/2020 05/18/2019 03/09/2018  Not completed: - (No Data) -  Orientation to time 2 4 4   Orientation to Place 4 4 5   Registration 3 3 3   Attention/ Calculation 5 5 5   Recall 2 1 2   Language- name 2 objects 2 2 2   Language- repeat 1 1 0  Language- follow 3 step command 3 2 3   Language- follow 3 step command-comments - he took the paper in his left hand -  Language- read & follow direction 1 1 1   Write a sentence 1 1 1   Copy design 1 1 1   Total score 25 25 27     Neurological examination  Mentation: Alert oriented to time, place, history taking. Follows all commands speech and language fluent Cranial nerve II-XII: Pupils were equal round reactive to light. Extraocular movements were full, visual field were full on confrontational test. Facial sensation and strength were normal. Head turning and shoulder shrug  were normal and symmetric. Motor: The motor testing reveals 5 over 5 strength of all 4 extremities. Good symmetric motor tone is noted throughout.  Sensory: Sensory testing is intact to soft touch on all 4 extremities. No evidence of extinction is noted.  Coordination: Cerebellar testing reveals good finger-nose-finger and heel-to-shin bilaterally.  Gait and  station: Gait is normal. Good pace/strides. Reflexes: Deep tendon reflexes are symmetric and normal bilaterally.   DIAGNOSTIC DATA (LABS, IMAGING, TESTING) - I reviewed patient records, labs, notes, testing and imaging myself where available.  Lab Results  Component Value Date   WBC 5.8 03/09/2018   HGB 14.8 03/09/2018   HCT 44.9 03/09/2018   MCV 91 03/09/2018   PLT 230 03/09/2018      Component Value Date/Time   NA 140 03/09/2018 1039   K 5.0 03/09/2018 1039   CL 99 03/09/2018 1039   CO2 22 03/09/2018 1039   GLUCOSE 329 (H) 03/09/2018 1039   BUN 15 03/09/2018 1039   CREATININE 1.00 03/09/2018 1039   CALCIUM 10.0 03/09/2018 1039   PROT 6.3 03/09/2018 1039   ALBUMIN 4.0 03/09/2018 1039   AST 14 03/09/2018 1039   ALT 18 03/09/2018 1039   ALKPHOS 109 03/09/2018 1039   BILITOT 0.5 03/09/2018 1039   GFRNONAA 73 03/09/2018 1039   GFRAA 85 03/09/2018 1039   No results found for: CHOL, HDL, LDLCALC, LDLDIRECT, TRIG, CHOLHDL No results found for: HGBA1C Lab Results  Component Value Date   VOHYWVPX10 626 03/09/2018   Lab Results  Component Value Date   TSH 1.850 03/09/2018   ASSESSMENT AND PLAN 79 y.o. year old male  has a past medical history of Arthritis, Cancer ([place]), Diabetes mellitus without complication ([place]), Heart murmur, Memory disorder (04/01/2017), Neuropathy, and Wears glasses. here with:  1.  Memory disturbance 2.  Apathy  3.  Type 2 diabetes  -MMSE fairly well-maintained 25/30, however wife notes larger changes in the home (thinks he works every day but does not, absolutely no interest in activity, increased appetite)  -Tried Zoloft, now on Effexor, no change noted  -Previously  sent for sleep evaluation, no significant sleep apnea was noted  -Is on oral B12  -Will send for neuropsychological evaluation potential for underlying depression contributing  -Will order repeat MRI of the brain, wife is quite concerned about overall progressive decline, now has  absolutely no interest in activity, seeing larger problems in the home than memory score would suggest (MRI 2018, showed generalized cortical atrophy, minimal age appropriate chronic microvascular ischemic change)  -Continue Aricept, Namenda  -Follow-up in 6 months or sooner if needed  I spent 30 minutes of face-to-face and non-face-to-face time with patient.  This included previsit chart review, lab review, study review, order entry, electronic health record documentation, patient education.  Butler Denmark, AGNP-C, DNP 02/20/2020, 9:51 AM [facility] [address] [place], [place] [address] [phone]

## 2020-02-20 NOTE — Telephone Encounter (Signed)
[employer] medicare order sent to [facility]. No auth they will reach out to the patient to schedule.  

## 2020-02-20 NOTE — Patient Instructions (Signed)
I will order MRI of the brain  Continue current medications Placed referral for neuropsych evaluation  See you back in 6 months

## 2020-02-21 NOTE — Progress Notes (Signed)
I have read the note, and I agree with the clinical assessment and plan.  Tyheim Vanalstyne K Candra Wegner   

## 2020-02-27 ENCOUNTER — Other Ambulatory Visit: Payer: Self-pay

## 2020-02-27 ENCOUNTER — Ambulatory Visit
Admission: RE | Admit: 2020-02-27 | Discharge: 2020-02-27 | Disposition: A | Discharge status: Home / Self Care | Payer: Medicare Other | Source: Ambulatory Visit | Attending: Neurology | Admitting: Neurology

## 2020-02-27 DIAGNOSIS — R413 Other amnesia: Secondary | ICD-10-CM

## 2020-02-29 ENCOUNTER — Telehealth: Payer: Self-pay | Admitting: Neurology

## 2020-02-29 NOTE — Telephone Encounter (Signed)
Spoke to wife of pt and relayed that the MRI brain results per SS/NP showed no acute changes.  Stable imaging compared to last MRI done 2018.  She verbalized understanding.

## 2020-02-29 NOTE — Telephone Encounter (Signed)
Dwan,Carole(wife on DPR) called stating she was returning the call to someone who called to give her results for pt.  Wife is asking for a call back

## 2020-03-27 ENCOUNTER — Ambulatory Visit: Payer: Medicare Other

## 2020-03-27 ENCOUNTER — Other Ambulatory Visit: Payer: Self-pay

## 2020-03-27 ENCOUNTER — Encounter: Payer: Self-pay | Admitting: Counselor

## 2020-03-27 ENCOUNTER — Ambulatory Visit (INDEPENDENT_AMBULATORY_CARE_PROVIDER_SITE_OTHER): Payer: Medicare Other | Admitting: Counselor

## 2020-03-27 DIAGNOSIS — R4189 Other symptoms and signs involving cognitive functions and awareness: Secondary | ICD-10-CM

## 2020-03-27 DIAGNOSIS — F0391 Unspecified dementia with behavioral disturbance: Secondary | ICD-10-CM | POA: Diagnosis not present

## 2020-03-27 DIAGNOSIS — F09 Unspecified mental disorder due to known physiological condition: Secondary | ICD-10-CM

## 2020-03-27 DIAGNOSIS — G3101 Pick's disease: Secondary | ICD-10-CM | POA: Diagnosis not present

## 2020-03-27 DIAGNOSIS — R4689 Other symptoms and signs involving appearance and behavior: Secondary | ICD-10-CM | POA: Diagnosis not present

## 2020-03-27 DIAGNOSIS — R453 Demoralization and apathy: Secondary | ICD-10-CM | POA: Diagnosis not present

## 2020-03-27 DIAGNOSIS — F0280 Dementia in other diseases classified elsewhere without behavioral disturbance: Secondary | ICD-10-CM

## 2020-03-27 NOTE — Progress Notes (Signed)
[facility]  Patient Name: Unnamed Zeien MRN: [id number] Date of Birth: 11-13-1940 Age: 79 y.o. Education: 14 years  Referral Circumstances and Background Information  Mr. Weston "Darnell Level" Stahnke is a 79 y.o., right-hand dominant, married man with a history of DMII, HTN, and profound apathy over the past several years. He was referred by Butler Denmark, NP at [facility] for neuropsychological assessment in the service of diagnostic clarification.     On interview, the patient stated that he is doing well, does not appreciate any problems with memory, thinking, or changes in behavior and he has no complaints. His insight is questionable. His wife stated that he "came to a complete stop in life," in July, 3 years ago. They were on vacation at the beach. She feels like he hasn't gotten going again since. He has profound apathy, going back 3 years ago, and sits on the couch often times for 12-15 hours a day. He has changes in dietary habits over the past year and is eating "constantly," his wife thinks he has gained about 50lbs in the past year or so but on chart review, it looks as though it is more like 20 lbs. He has gone from a size 38 to 44 in the waist. He has minimal disinhibited symptoms and is not going up to strangers inappropriately or using rough language in public, although he does have hyperorality, he shovels food into his mouth "like someone is going to take it away." He has esophageal problems that are worsened by this manner of eating so this is particularly noteworthy. He has diminished sympathy and empathy, his wife has tried to tell him the impact his behaviors are having on her and he doesn't seem to care. He has no inappropriate anger although he has started saying "little" things to his wife when she attempts to get him to do things, that are unkind, and not like him in the past. He has stereotyped/repetitive behaviors, in the form of whistling, which was quite  evident today during his appointment and was nearly constant. He also seemed to repetitively twitch his fingers. He has no hoarding behaviors, collecting behaviors, or perseveration on particular ideas. He takes a shower everyday but he will soil himself, of urine and feces, and then he will not clean himself unless prompted to by his wife. When she asks him to change his clothes he will do so but will not shower unless specifically instructed. This happens nearly every day, multiple times a day, and has been an issue for two years. He has one episode of possible hallucination/delusion, his wife came home and he had the door open when the weather was cold. He said his friend was there, in the bathroom, but there was no one there. He has no paranoia or delusions or suspiciousness. With respect to mood, the patient has no complaints. With respect to sleep, he sleeps well, perhaps excessively, often 12 hours. He used to sleep about 9 hours. He jerks sometimes at night and his wife said there was one occasion where he was saying something she couldn't make out, several weeks ago, but it was isolated and has not recurred. He had a sleep study when he initially presented for care in 2018 that was normal. He thinks his energy is good.   With respect to functioning, the patient has very few to no meaningful activities. He will get up, he will take a bath, and then he alternates between eating and watching TV  essentially all day. The only time he gets up is to go to the bathroom and sometimes he won't do that. He used to fix his own meals and he hasn't been doing that over the past year. He can do some very basic things like make sandwiches if his wife prompts him. He tells me today that he is still working, but he is not. His wife stated that until three years ago, he was "always working, all the time," either at his job or in his shop, and he doesn't do any of that now. He was for a time getting up and driving around in  circles and would say he was going to work but he wasn't. His wife contacted his former employer, who said he would walk in the shop, look around, and leave. They knew there was something wrong as far as 2 years ago. His wife has taken the keys since then because she doesn't trust him. He doesn't leave the house alone, he may sit on the deck for a minute, but he doesn't do anything outside. He is somewhat attached to his wife and wants to go with her if she goes anywhere, "If my car moves, he wants to go with it," although he wants to sit in the car typically and wont go into the store. There was one instance where he went wandering when he was sitting in the car when they were at [place]. He doesn't wander at home. He soils himself, every day, 2 or 3 times a day, both of urine and feces. He has been instructed to do timed toileting but he won't do it. He needs to be prompted to remember his medications and does not manage money (his wife always has).   Past Medical History and Review of Relevant Studies   Patient Active Problem List   Diagnosis Date Noted  . Apathy 02/20/2020  . Memory disorder 04/01/2017    Review of Neuroimaging and Relevant Medical History: The patient has a recent MRI of the brain from 02/20/2020, which reveals a moderate burden of asymmetric (R > L) likely pathologic volume loss. There is frontal and temporal predominance, with relatively less in the parietal areas. There is minimal leukoaraiosis. Comparing to a previous study in 2018, there is some progression to my eye, more notable in the right frontal area. 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   Current Outpatient Medications  Medication Sig Dispense Refill  . aspirin 81 MG tablet Take 81 mg by mouth daily.    . Cyanocobalamin (B-12 PO) Take 1 tablet by mouth daily.    Marland Kitchen  donepezil (ARICEPT) 10 MG tablet TAKE 1 TABLET BY MOUTH AT BEDTIME 90 tablet 3  . glipiZIDE (GLUCOTROL) 5 MG tablet Take 5 mg by mouth 2 (two) times daily before a meal.  3  . insulin glargine (LANTUS) 100 UNIT/ML injection Inject into the skin daily. 25 units daily    . lisinopril (PRINIVIL,ZESTRIL) 5 MG tablet TAKE 1 TABLET BY MOUTH EVERY DAY    . memantine (NAMENDA) 10 MG tablet Take 1 tablet (10 mg total) by mouth 2 (two) times daily. 180 tablet 4   No current facility-administered medications for this visit.    Family History  Problem Relation Age of Onset  . Dementia Mother   . Alzheimer's disease Mother   . Dementia Father    There is a strong family history  of dementia. The patient's father has a history of dementia, which was apparently called "hardening of the arteries," although he also apparently had compulsive eating like the patient and developed the condition early in life, making me wonder if that wasn't due to another condition. He died at 20 and was retired from work 2 years prior to that. His grandfather also developed dementia and was institutionalized, in his 88s. His mother passed at 67 and also had dementia, which was diagnosed as Alzheimer's. They noticed changes in the mid 80s. Apparently several other aunts and uncles developed dementia, as per his previous history (see note of Dr. Jannifer Franklin 04/01/2017) although they didn't mention that today. He had a brother who died younger, related to medical health problems. There is no  family history of psychiatric illness.  Psychosocial History  Developmental, Educational and Employment History: The patient reported a normal childhood development and denied any history of abuse. He did fine in school, he was more mechanically inclined than academic but he was never held back and had no problems. 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 For work, he was mainly a Architect. 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 He retired around 78 but was still working part time until perhaps a year ago. She talked to his boss and his boss stated that he had noticed changes over at least the past 2 years. 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   Psychiatric History: None  Substance Use History: He used to use smokeless tobacco but quit about 5 years ago.   Relationship History and Living Cimcumstances: The patient and his wife have been married 11 years. The have two boys who live in the area. They have noticed the changes. Unfortunately, they both work, but they are available if needed.   Mental Status and Behavioral Observations  Sensorium/Arousal: The patient's level of arousal was awake and alert. Hearing and vision were adequate with correction (patient forgot his glasses, used readers and said he could see everything fine) for testing purposes. Orientation: The patient was oriented to person, place, and to some extent situation. He knew the year but was not aware of the month, day, or date.  Appearance: Dressed in appropriate, casual clothing with adequate grooming and hygiene.  Behavior: Presented as somewhat abulic, lacking in spontaneity, and had little spontaneous speech. Noted to purse lips and breathe through mouth, making whistling noise, much of the evaluation.  Speech/language: Poverty of thought suggested by the patient's lack of spontaneous speech. He spoke in a hoarse voice, which is apparently related to esophageal issues. No speech apraxia or word finding problems.  Gait/Posture: Appeared reasonably stable without any shuffling, perhaps mildly widened base although that may be body habitus. Wife reported she has noticed shuffling.  Movement: The patient had normal tone, strength, and reflexes bilaterally at his last exam. Today, I  questioned if there were not fasciculations of the right arm and left hand. No grasp reflex.  Social Comportment: Minimally interactive, no inappropriate or disinhibited behaviors Mood: "Good" Affect: Flat, some variability in response to conversation Thought process/content: Thought process was difficult to assess given lack of spontaneous speech. He was able to answer questions posed to him directly but did not seem to do so accurately as per  objective information. Thought content was impoverished.  Safety: No safety concerns identified in this abulic patient Insight: Markedly impaired  Montreal Cognitive Assessment  03/27/2020  Visuospatial/ Executive (0/5) 3  Naming (0/3) 3  Attention: Read list of digits (0/2) 2  Attention: Read list of letters (0/1) 0  Attention: Serial 7 subtraction starting at 100 (0/3) 3  Language: Repeat phrase (0/2) 2  Language : Fluency (0/1) 0  Abstraction (0/2) 1  Delayed Recall (0/5) 0  Orientation (0/6) 3  Total 17  Adjusted Score (based on education) 18   Test Procedures  Wide Range Achievement Test - 4             Word Reading Neuropsychological Assessment Battery  List Learning  Story Learning  Daily Living Memory  Naming  Digit Span Repeatable Battery for the Assessment of Neuropsychological Status (Form A)  Figure Copy  Judgment of Line Orientation  Coding  Figure Recall The Dot Counting Test A Random Letter Test Controlled Oral Word Association (F-A-S) Semantic Fluency (Animals) Trail Making Test A & B Complex Ideational Material Modified [place] Card Sorting Test Geriatric Depression Scale - Short Form Quick Dementia Rating System (completed by wife, Arbie Cookey)  Plan  Yehoshua Vitelli was seen for a psychiatric diagnostic evaluation and neuropsychological testing. 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 In clinic today, he has no insight and was entirely unconcerned about the changes she reported. He also has an MRI with a fairly characteristic atrophy pattern. He is screening in the mild dementia range on the MoCA. Full and complete note with impressions, recommendations, and interpretation of test data to follow.   Viviano Simas Nicole Kindred, PsyD, [facility] Clinical Neuropsychologist  Informed Consent and Coding/Compliance  Risks and benefits of the evaluation were discussed with the patient prior to all testing procedures. I conducted a clinical interview and neuropsychological testing (at least two tests) with Sharyn Dross and Lamar Benes, B.S. (Technician) administered additional test procedures. The patient was able to tolerate the testing procedures and the patient (and/or family if applicable) is likely to benefit from further follow up to receive the diagnosis and treatment recommendations, which will be rendered at the next encounter. Billing below reflects technician time, my direct face-to-face time with the patient, time spent in test administration, and time spent in professional activities including but not limited to: neuropsychological test interpretation, integration of neuropsychological test data with clinical history, report preparation, treatment planning, care coordination, and review of diagnostically pertinent medical history or studies.   Services associated with this encounter: Clinical Interview [phone]) plus 60 minutes (82707; Neuropsychological Evaluation by Professional)  155 minutes (86754; Neuropsychological Evaluation by Professional, Adl.) 19 minutes (49201; Test Administration by Professional) 30 minutes (00712; Neuropsychological Testing by Technician) 50 minutes (19758; Neuropsychological Testing by Technician, Adl.)

## 2020-03-27 NOTE — Progress Notes (Signed)
   Psychometrist Note   Cognitive testing was administered to Joseph Lawson by Joseph Lawson, B.S. (Technician) under the supervision of Joseph Lawson, Psy.D., ABN. [name] was able to tolerate all test procedures. Dr. Nicole Kindred met with the patient as needed to manage any emotional reactions to the testing procedures. Rest breaks were offered.    The battery of tests administered was selected by Dr. Nicole Kindred with consideration to the patient's current level of functioning, the nature of his symptoms, emotional and behavioral responses during the interview, level of literacy, observed level of motivation/effort, and the nature of the referral question. This battery was communicated to the psychometrist. Communication between Dr. Nicole Kindred and the psychometrist was ongoing throughout the evaluation and Dr. Nicole Kindred was immediately accessible at all times. Dr. Nicole Kindred provided supervision to the technician on the date of this service, to the extent necessary to assure the quality of all services provided.    [name] will return in approximately one week for an interactive feedback session with Dr. Nicole Kindred, at which time test performance, clinical impressions, and treatment recommendations will be reviewed in detail. The patient understands he can contact our office should he require our assistance before this time.   A total of 80 minutes of billable time were spent with Joseph Lawson by the technician, including test administration and scoring time. Billing for these services is reflected in Dr. Les Pou note.   This note reflects time spent with the psychometrician and does not include test scores, clinical history, or any interpretations made by Dr. Nicole Kindred. The full report will follow in a separate note.

## 2020-03-28 NOTE — Progress Notes (Signed)
[facility]  Patient Name: Joseph Lawson MRN: [id number] Date of Birth: 1941/01/10 Age: 79 y.o. Education: 14 years  Measurement properties of test scores: IQ, Index, and Standard Scores (SS): Mean = 100; Standard Deviation = 15 Scaled Scores (Ss): Mean = 10; Standard Deviation = 3 Z scores (Z): Mean = 0; Standard Deviation = 1 T scores (T); Mean = 50; Standard Deviation = 10  TEST SCORES:    Note: This summary of test scores accompanies the interpretive report and should not be interpreted by unqualified individuals or in isolation without reference to the report. Test scores are relative to age, gender, and educational history as available and appropriate.   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      Expected Functioning        Wide Range Achievement Test: Standard/Scaled Score Percentile      Word Reading 97 42      Attention/Processing Speed        Neuropsychological Assessment Battery (Attention Module, Form 1): T-score Percentile      Digits Forward 44 27      Digits Backwards 37 9      Repeatable Battery for the Assessment of Neuropsychological Status (Form A): Scaled Score Percentile      Coding 3 1      Language        Neuropsychological Assessment Battery (Language Module, Form 1): T-score Percentile      Naming   (30) 59 82      Verbal Fluency: T-score Percentile      Controlled Oral Word Association (F-A-S) 33 5      Semantic Fluency (Animals) 42 21      Memory:        Neuropsychological Assessment Battery (Memory Module, Form 1): T-score Percentile      List Learning           List A Immediate Recall   (3, 3, 3) 22 <1         List B Immediate Recall   (0) 25 1         List A SHe hort Delayed Recall   (0) 23 <1         List A  Long Delayed Recall   (0) 30 2         List A Percent Retention   (0 %) --- <1         List A Long Delayed Yes/No Recognition Hits   (0) --- <1         List A Long Delayed Yes/No Recognition False Alarms   (0) --- 93         List A Recognition Discriminability Index --- 5     Story Learning           Immediate Recall   (12, 8) 24 <1         Delayed Recall   (0) 32 4         Percent Retention   (0 %) --- <1      Daily Living Memory            Immediate Recall   (17, 16) 41 18  Delayed Recall   (0, 0) 19 <1          Percent Retention (0 %) --- <1          Recognition Hits    (4) --- <1      Repeatable Battery for the Assessment of Neuropsychological Status (Form A): Scaled Score Percentile         Figure Recall   (10) 8 25      Visuospatial/Constructional Functioning        Repeatable Battery for the Assessment of Neuropsychological Status (Form A): Standard/Scaled Score Percentile     Visuospatial/Constructional Index 96 39         Figure Copy   (19) 12 75         Judgment of Line Orientation   (13) --- 10-16      Executive Functioning        [employer] Test (MWCST): Standard/T-Score Percentile      Number of Categories Correct 19 <1      Number of Perseverative Errors 20 <1      Number of Total Errors 21 <1      Percent Perseverative Errors 28 2  Executive Function Composite 54 <1      Trail Making Test: T-Score Percentile      Part A 32 4      Part B 19 <1      Boston Diagnostic Aphasia Exam: Raw Score Scaled Score      Complex Ideational Material 11 9      Clock Drawing Raw Score Descriptor      Command 8 Borderline Impairment      Rating Scales        Clinical Dementia Rating Raw Score Descriptor      Sum of Boxes 11.0 Moderate Dementia      Global Score 3.0 Severe Dementia      Quick Dementia Rating System Raw Score Descriptor      Sum of Boxes 14 Moderate Dementia      Total Score 18.5 Severe Dementia  Geriatric Depression Scale -  Short Form 0 Negative   Yvette Loveless V. Nicole Kindred PsyD, [facility] Clinical Neuropsychologist

## 2020-04-01 NOTE — Progress Notes (Signed)
[facility]  Patient Name: Joseph Lawson MRN: [id number] Date of Birth: 1941/03/20 Age: 79 y.o. Education: 12 years  Clinical Impressions  Joseph Lawson is a 79 y.o., right-hand dominant, married man with a history of DMII, HTN, and profound apathy over the past 3 years. He was referred by Butler Denmark, NP at [facility] for evaluation in the service of diagnostic clarification. As the years have passed, he has developed hyperorality with weight gain, diminished sympathy/empathy, disregard for personal hygiene (soiling himself and then not cleaning up unless prompted), and stereotyped/repetitive behaviors (whistling, twitching fingers). He has an MRI that shows asymmetric R > L volume loss with preferential involvement of the frontal and temporal lobes, although there is some atrophy in other areas as well.  On neuropsychological testing, Joseph Lawson demonstrated fairly significant impairment, with notable executive dysfunction that likely undermined his performance in other areas. Qualitatively, he had notable perseveration on rampart drawing, loop drawing, and on delayed word list recognition (responding "no" to every item). Memory was also problematic, although his preserved delayed recall of visual information makes me wonder if these low scores aren't at least partially artifactual, related to his very notable executive problems. Visuospatial/constructional functioning and attention presented as areas of relative preservation. He also had good visual object confrontation naming. He had extremely to unusually low scores on measures of processing speed. His wife rated him as functioning at a severe dementia level of function, although his CDR suggests a more moderate level, and admittedly much of that is on the basis of neuropsychiatric symptoms (e.g., apathy).   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 As is often the case in bvFTD, he is performing relatively better on cognitive testing than he is in the real world. He has a strong family history of dementia, which is also common in FTD syndromes. I will instruct the patient and his wife in the use of behavioral strategies for managing his apathy and disordered eating behavior, which is particularly problematic given his T2DM. His apathy is likely secondary to his dementia as opposed to on the basis of a mood disorder, although treatment with an antidepressant could be considered and can sometimes be helpful. They may also benefit from consultation with elder law. He should not drive given his significant executive and other cognitive impairments. They may benefit from referral to academic medicine for participation in clinical trials and/or other research if interested.   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 Given that you are now substantially functionally impaired, I think that dementia is the best term for your problem.   Dementia refers to a group of syndromes where multiple areas of ability are damaged in the brain, such as memory, thinking, judgment, and behavior, and most commonly refers to age related causes of dementia that cause worsening in these abilities over time. Alzheimer's disease is the most common form of dementia in people over the age of 74. Not all dementias are Alzheimer's disease, but all Alzheimer's disease is dementia. When dementia is due to an underlying condition affecting the  brain, such as Alzheimer's disease, there is progression over time, which  typically procedes gradually over many years.   In your case, your dementia is unlikely due to Alzheimer's disease. I have a high level of confidence that instead, you have behavioral variant frontotemporal dementia (bvFTD). This is a brain disease like Alzheimer's but is caused by a different pathology. This condition typically presents with very profound apathy (I.e., lack of motivation) and/or disinhibited behavior. Like Alzheimer's, bvFTD is caused by an underlying brain disease and worsens progressively over time.   Dementia is typically associated with cognitive decline (I.e., memory and thinking problems), but it can involve a host of other behavioral and psychiatric symptoms. These symptoms are broadly known as "neuropsychiatric symptoms" because they are symptoms like we see in psychiatric illness but they are due to underlying changes in the brain. Things like depression, profound apathy (i.e., not wanting to do anything, diminished initiation of behavior), disinhibited and/or inappropriate behavior, delusions, and hallucinations can all be caused by the changes in the brain that accompany dementia. The most effective treatment for these symptoms is to deal with them behaviorally, that is by family members providing reassurance, not being alarmed, and realizing that they are just another part of the disease process. A small amount of reassurance often goes a long way. Distracting your loved one from what it is that is bothering them can also be helpful.   Apathy is a common problem in a number of conditions, including dementia and depression. Apathy robs individuals of their motivation to engage in activities that they once enjoyed and can contribute to a sedentary lifestyle. Often, gentle encouragement (e.g., encouraging your loved one to go on a walk with you and taking the first step) can be extremely helpful. Attempt to  motivate through leading by example, providing encouragement, and reinforcing desired behavior. If that is ineffective be persistent but don't be insistent or demanding, which can provoke agitation. Once routines are established they can be hard to change, so do your best to help your loved one maintain an active and healthy routine that includes exercise, meaningful interactions with others, and quality time with each other.   Frontotemporal dementias greatly impact an individuals volitional behavior, which includes controlling things such as eating or satisfying other bodily drives. The best way to manage this is using stimulus control methods. These include common sense strategies such as keeping unhealthy food out of the house, preparing healthy and balanced meals with appropriate portion sizes and the like.   As with all capable adults, I would recommend that you consult with an elder care attorney regarding advanced directives if you have not, such as a healthcare power of attorney and living will. Consultation with an elder care attorney can help you protect your estate and communicate your preferences to your loved ones formally, in the event you are not able to do so yourself. I can provide my strong recommendation for the [employer], who are located at [address] in [place], [place]. They can be reached at [phone]. They also have a website [email or web address].   The above information was supplemented by handouts on symptoms commonly associated with bvFTD including a detailed handout on managing apathy in bvFTD from www.theaftd.org, a patient advocacy group for individuals with FTD.   Test Findings  Test scores are summarized in additional documentation associated with this encounter. Test scores are relative to age, gender, and educational history as available and appropriate. Performance validity indicators fell somewhat below expectations although in Joseph Lawson case,  this likely  reflects genuine cognitive impairment as opposed to a validity problem per se.   General Intellectual Functioning/Achievement:  Performance on single word reading was average, which presents as a reasonable standard of comparison for Joseph Lawson's cognitive test performance.   Attention and Processing Efficiency: Performance on indicators of attention and working memory was within gross limits of normal, with average digit repetition forward and low average digit repetition backwards.   Performance on timed measures of processing speed was low, with extremely low timed number-symbol coding (some of this may be executively/attentionally mediated) and unusually low simple numeric sequencing.   Language: Indicators of language showed intact visual object confrontation naming. His verbal fluency performance suggested frontal-subcortical type problems, with unusually low phonemic fluency and low average animal fluency.   Visuospatial Function: Performance was well preserved on visuospatial/constructional measures, with average figure copy and low average judgment of angular line orientations.   Learning and Memory: Performance on measures of learning and memory was low, with marked encoding difficulties for verbal information and very poor delayed recall with no improvement to recognition cuing. He did better with respect to visual information, with average delayed recall for a modestly complex figure stimulus. The profile may represent a storage problem but it might also reflect very significant encoding difficulties on the basis of executive impairment.   In the verbal realm, immediate recall for a 12-item word list and short story was extremely low. Following a standard delay, he did not recall any of the items from the word list or the short story, suggesting impaired capacities. Yes/no recognition for the words from the list versus false choices was qualitatively notable for  perseveration, saying "no" to every item. He did better when learning brief daily-living type information involving a name, address, and phone number and medication instructions, with a low average score. His retention for that information was still poor with no details retained. His recognition discriminability was extremely low.   In the visual realm, Joseph Lawson performed at an average level when freely recalling a modestly complex geometric figure following a standard delay.   Executive Functions: Performance across the test battery was suggestive of notable executive difficulties as was the patient's qualitative presentation. He was abulic, extremely flat, and had little spontaneous behavior. On formal measures he generated an extremely low Executive Function Composite score on the [employer]. His generation of words in response to the letters F-A-S was unusually low as compared to better, low average, semantic fluency, implicating frontal subcortical system dysfunction. His clock drawing was "borderline," with no face yet good number and hand placement. He performed at an extremely low level on alternating sequencing of numbers and letters of the alphabet. Reasoning with complex verbal information was a bit better, falling at an average level. He demonstrated qualitative perseveration on rampart drawing, loop drawing, and when responding to yes/no word list delayed recognition.   Rating Scale(s): Joseph Lawson denied any complaints whatsoever on self-report screening of depression. His wife rated him as functioning at a moderate to severe dementia level. His CDR places him in the moderate range, as he has essentially no instrumental activities of daily living and is starting to have difficulties with even basic ADLs related to his profound apathy and neuropsychiatric symptoms.   Viviano Simas Nicole Kindred PsyD, [place] Clinical Neuropsychologist

## 2020-04-04 ENCOUNTER — Encounter: Payer: Self-pay | Admitting: Counselor

## 2020-04-04 ENCOUNTER — Other Ambulatory Visit: Payer: Self-pay

## 2020-04-04 ENCOUNTER — Ambulatory Visit (INDEPENDENT_AMBULATORY_CARE_PROVIDER_SITE_OTHER): Payer: Medicare Other | Admitting: Counselor

## 2020-04-04 DIAGNOSIS — F0281 Dementia in other diseases classified elsewhere with behavioral disturbance: Secondary | ICD-10-CM | POA: Diagnosis not present

## 2020-04-04 DIAGNOSIS — G3109 Other frontotemporal dementia: Secondary | ICD-10-CM

## 2020-04-04 DIAGNOSIS — R453 Demoralization and apathy: Secondary | ICD-10-CM | POA: Diagnosis not present

## 2020-04-04 NOTE — Patient Instructions (Addendum)
Your performance and presentation on assessment were consistent with fairly significant cognitive problems, with the main difficulty being executive functioning, which involves shifting from one thing to the next, problem solving, and also non-cognitive abilities including volition and intentional action. Given that you are now substantially functionally impaired, I think that dementia is the best term for your problem.   Dementia refers to a group of syndromes where multiple areas of ability are damaged in the brain, such as memory, thinking, judgment, and behavior, and most commonly refers to age related causes of dementia that cause worsening in these abilities over time. Alzheimer's disease is the most common form of dementia in people over the age of 87. Not all dementias are Alzheimer's disease, but all Alzheimer's disease is dementia. When dementia is due to an underlying condition affecting the brain, such as Alzheimer's disease, there is progression over time, which typically procedes gradually over many years.   In your case, your dementia is unlikely due to Alzheimer's disease. I have a high level of confidence that instead, you have behavioral variant frontotemporal dementia (bvFTD). This is a brain disease like Alzheimer's but is caused by a different pathology. This condition typically presents with very profound apathy (I.e., lack of motivation) and/or disinhibited behavior. Like Alzheimer's, bvFTD is caused by an underlying brain disease and worsens progressively over time.   Dementia is typically associated with cognitive decline (I.e., memory and thinking problems), but it can involve a host of other behavioral and psychiatric symptoms. These symptoms are broadly known as "neuropsychiatric symptoms" because they are symptoms like we see in psychiatric illness but they are due to underlying changes in the brain. Things like depression, profound apathy (i.e., not wanting to do anything,  diminished initiation of behavior), disinhibited and/or inappropriate behavior, delusions, and hallucinations can all be caused by the changes in the brain that accompany dementia. The most effective treatment for these symptoms is to deal with them behaviorally, that is by family members providing reassurance, not being alarmed, and realizing that they are just another  part of the disease process. A small amount of reassurance often goes a long way. Distracting your loved one from what it is that is bothering them can also be helpful.   Apathy is a common problem in a number of conditions, including dementia and depression. Apathy robs individuals of their motivation to engage in activities that they once enjoyed and can contribute to a sedentary lifestyle. Often, gentle encouragement (e.g., encouraging your loved one to go on a walk with you and taking the first step) can be extremely helpful. Attempt to motivate through leading by example, providing encouragement, and reinforcing desired behavior. If that is ineffective be persistent but don't be insistent or demanding, which can provoke agitation. Once routines are established they can be hard to change, so do your best to help your loved one maintain an active and healthy routine that includes exercise, meaningful interactions with others, and quality time with each other.  "if Frontotemporal dementias greatly impact an individuals volitional behavior, which includes controlling things such as eating or satisfying other bodily drives. The best way to manage this is using stimulus control methods. These include common sense strategies such as keeping unhealthy food out of the house, preparing healthy and balanced meals with appropriate portion sizes and the like.   As with all capable adults, I would recommend that you consult with an elder care attorney regarding advanced directives if you have not, such as a healthcare power  of attorney and living  will. Consultation with an elder care attorney can help you protect your estate and communicate your preferences to your loved ones formally, in the event you are not able to do so yourself. I can provide my strong recommendation for the [employer], who are located at [address] in [place], [place]. They can be reached at [phone]. They also have a website [email or web address].   The above information was supplemented by handouts on symptoms commonly associated with bvFTD including a detailed handout on managing apathy in bvFTD from www.theaftd.org, a patient advocacy group for individuals with FTD.

## 2020-04-04 NOTE — Progress Notes (Signed)
NEUROPSYCHOLOGY FEEDBACK NOTE [facility]  Feedback Note: I met with Sharyn Dross to review the findings resulting from his neuropsychological evaluation. Since the last appointment, he has been about the same. They went on a brief vacation, and he refused to walk anywhere, or to ride in a golf cart. It made the trip "A living hell" for his wife Archie Patten and the other family members who attended. Time was spent reviewing the impressions and recommendations that are detailed in the evaluation report. We discussed the impression of bvFTD at length, and the manner in which this condition differs from other dementias, by virtue of the extent to which it affects volitional activity, impulse control, and various non-cognitive brain-behavior relationships. Mr. Demas also does have substantial cognitive impairment. I discussed the role of in home caregiving, and appropriate setting for people at this level of functioning, which include more structured setting such as SNF. I coached his wife in the use of routine and other behavioral strategies, she often tries to reason with him or convince him, which is unlikely to be effective.  as reflected in the patient instructions. I took time to explain the findings and answer all the patient's questions. I encouraged Mr. Petrich to contact me should he have any further questions or if further follow up is desired.   Current Medications and Medical History   Current Outpatient Medications  Medication Sig Dispense Refill   aspirin 81 MG tablet Take 81 mg by mouth daily.     Cyanocobalamin (B-12 PO) Take 1 tablet by mouth daily.     donepezil (ARICEPT) 10 MG tablet TAKE 1 TABLET BY MOUTH AT BEDTIME 90 tablet 3   glipiZIDE (GLUCOTROL) 5 MG tablet Take 5 mg by mouth 2 (two) times daily before a meal.  3   insulin glargine (LANTUS) 100 UNIT/ML injection Inject into the skin daily. 25 units daily     lisinopril (PRINIVIL,ZESTRIL) 5 MG tablet TAKE 1 TABLET BY MOUTH  EVERY DAY     memantine (NAMENDA) 10 MG tablet Take 1 tablet (10 mg total) by mouth 2 (two) times daily. 180 tablet 4   No current facility-administered medications for this visit.    Patient Active Problem List   Diagnosis Date Noted   Apathy 02/20/2020   Memory disorder 04/01/2017    Mental Status and Behavioral Observations  Ishmael Berkovich presented on time to the present encounter and was alert and generally oriented. Speech was normal in rate, rhythm, volume, and prosody. Self-reported mood was "If I was any better I'd have to take something for it" and affect was flat with moments of appropriate variability in response to conversation. Thought process was difficult to assess, he participated minimally and thought content was impoverished. There were no safety concerns identified at today's encounter, such as thoughts of harming self or others.   Plan  Feedback provided regarding the patient's neuropsychological evaluation. He has bvFTD, with support from his history, imaging, and neuropsych test data. I provided his wife with extensive instruction in behavioral strategies for managing apathy, which was supplemented by a handout. I also provided her with referrals for elder law and she is connected in with the support group community and will follow up regarding a group that works with her schedule. Ossiel Marchio was encouraged to contact me if any questions arise or if further follow up is desired.   Viviano Simas Nicole Kindred, PsyD, ABN Clinical Neuropsychologist  Service(s) Provided at This Encounter: 19 minutes [phone]; Psychotherapy with patient/family)

## 2020-04-09 ENCOUNTER — Telehealth: Payer: Self-pay | Admitting: Neurology

## 2020-04-09 NOTE — Telephone Encounter (Signed)
I called the patient, to discuss findings from neuropsychological evaluation, I talked to his wife.  Results are felt to be consistent with bvFTD.  He is on Aricept and Namenda, more evidence of benefit with Namenda, less evidence of Aricept, can sometimes worsen behavior. He will stop at the Aricept taking 5 mg at bedtime x 2 weeks then stop. Get him set up to see Dr. Jannifer Franklin in a few weeks.

## 2020-04-10 NOTE — Telephone Encounter (Signed)
Called and spoke with patient's wife, was able to schedule patient in on 05/07/20 @ noon.  She took the appointment, agreed to be here at 1145 for check in.  Expressed appreciaiton.

## 2020-05-07 ENCOUNTER — Encounter: Payer: Self-pay | Admitting: Neurology

## 2020-05-07 ENCOUNTER — Other Ambulatory Visit: Payer: Self-pay

## 2020-05-07 ENCOUNTER — Ambulatory Visit: Payer: Medicare Other | Admitting: Neurology

## 2020-05-07 DIAGNOSIS — F0280 Dementia in other diseases classified elsewhere without behavioral disturbance: Secondary | ICD-10-CM | POA: Diagnosis not present

## 2020-05-07 DIAGNOSIS — G3109 Other frontotemporal dementia: Secondary | ICD-10-CM

## 2020-05-07 HISTORY — DX: Dementia in other diseases classified elsewhere, unspecified severity, without behavioral disturbance, psychotic disturbance, mood disturbance, and anxiety: F02.80

## 2020-05-07 NOTE — Progress Notes (Signed)
Reason for visit: Frontotemporal dementia  Joseph Lawson is an 79 y.o. male  History of present illness:  Joseph Lawson is a 79 year old right-handed white male with a history of a progressive dementing illness over the last 3 to 4 years.  The patient has developed increasing problems with social withdrawal, he will sit in his house and watch TV all day long, he does no exercise, has no desire to go out and do things.  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  The patient has been taken off of Aricept, he remains on Namenda.  The patient has been placed on antidepressant medications that have offered no benefit.  The patient claims he does not feel depressed, the wife concurs with this.  They return to the office today for an evaluation.  Past Medical History:  Diagnosis Date  . Arthritis   . Cancer ([place])   . Diabetes mellitus without complication ([place])   . Heart murmur   . Memory disorder 04/01/2017  . 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 Wears glasses     Past Surgical History:  Procedure Laterality Date  . BACK SURGERY  1995   lumb lam  . COLONOSCOPY    . DENTAL RESTORATION/EXTRACTION WITH X-RAY    . KNEE ARTHROSCOPY  2012   right  . SALIVARY GLAND SURGERY  1980   right neck  . SHOULDER OPEN ROTATOR CUFF REPAIR Right 09/05/2012   Procedure: RIGHT SHOULDER ROTATOR CUFF REPAIR WITH DISTAL CLAVICLE EXCISION/BICEPS TENOTOMY;  Surgeon: Jolyn Nap, MD;  Location: [facility];  Service: Orthopedics;  Laterality: Right;    Family History  Problem Relation Age of Onset  . Dementia Mother   . Alzheimer's disease Mother   . Dementia Father     Social history:  reports that he quit smoking about 27 years ago. He has quit using smokeless tobacco. He reports current alcohol use. He reports that he does not use drugs.   No Known Allergies  Medications:  Prior to Admission  medications   Medication Sig Start Date End Date Taking? Authorizing Provider  aspirin 81 MG tablet Take 81 mg by mouth daily.   Yes [provider]  Cyanocobalamin (B-12 PO) Take 1 tablet by mouth daily.   Yes [provider]  glipiZIDE (GLUCOTROL) 5 MG tablet Take 5 mg by mouth 2 (two) times daily before a meal. 03/03/18  Yes [provider]  insulin glargine (LANTUS) 100 UNIT/ML injection Inject into the skin daily. 25 units daily   Yes [provider]  losartan (COZAAR) 25 MG tablet Take 25 mg by mouth daily.   Yes [provider]  memantine (NAMENDA) 10 MG tablet Take 1 tablet (10 mg total) by mouth 2 (two) times daily. 02/20/20  Yes Suzzanne Cloud, NP  venlafaxine (EFFEXOR) 37.5 MG tablet Take 37.5 mg by mouth 2 (two) times daily.   Yes [provider]  donepezil (ARICEPT) 10 MG tablet TAKE 1 TABLET BY MOUTH AT BEDTIME 11/20/19   Suzzanne Cloud, NP  lisinopril (PRINIVIL,ZESTRIL) 5 MG tablet TAKE 1 TABLET BY MOUTH EVERY DAY 10/14/16   [provider]    ROS:  Out of a complete 14 system review of symptoms, the patient complains only of the following symptoms, and all other reviewed systems are negative.  Weight gain Fatigue  Blood pressure (!) 153/88, pulse 80, height 6\' 3"  (1.905 m), weight  269 lb (122 kg).  Physical Exam  General: The patient is alert and cooperative at the time of the examination.  The patient is moderately obese.  Skin: No significant peripheral edema is noted.   Neurologic Exam  Mental status: The patient is alert and oriented x 3 at the time of the examination.   Cranial nerves: Facial symmetry is present. Speech is normal, no aphasia or dysarthria is noted. Extraocular movements are full. Visual fields are full.  Motor: The patient has good strength in all 4 extremities.  Sensory examination: Soft touch sensation is symmetric on the face, arms, and legs.  Coordination: The patient has good  finger-nose-finger and heel-to-shin bilaterally.  Gait and station: The patient has a normal gait. Tandem gait is normal. Romberg is negative. No drift is seen.  Reflexes: Deep tendon reflexes are symmetric.   Assessment/Plan:  1.  Frontotemporal dementia  The patient will continue on Namenda for now, he is very inactive during the day, he has a significant degree of apathy and abulia.  He has not had rapid progression of general memory issues.  He will follow-up in about 6 months.  He will be taken off of the Effexor at this time.  Jill Alexanders MD 05/07/2020 11:58 AM  [facility] [address] [place], [place] [address]  Phone [phone] Fax [phone]

## 2020-05-19 IMAGING — DX DG HAND COMPLETE 3+V*R*
3 series · 3 of 3 positions shown · non-contrast
Comparison: None.

CLINICAL DATA: Laceration

EXAM:
RIGHT HAND - COMPLETE 3+ VIEW

[hand pa]
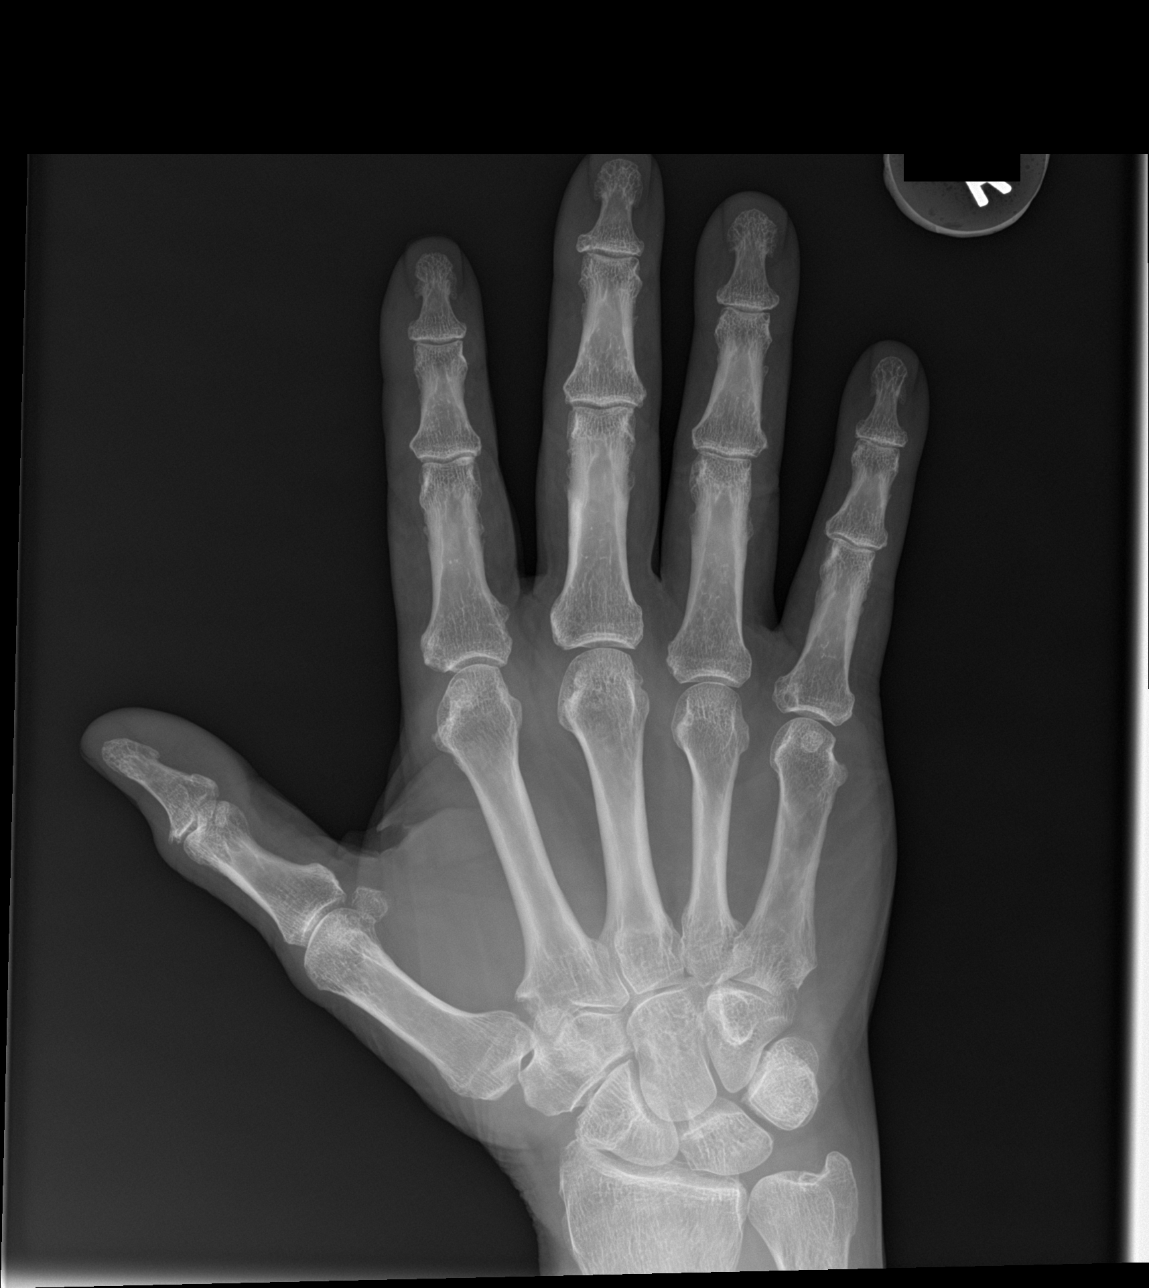

[hand obl]
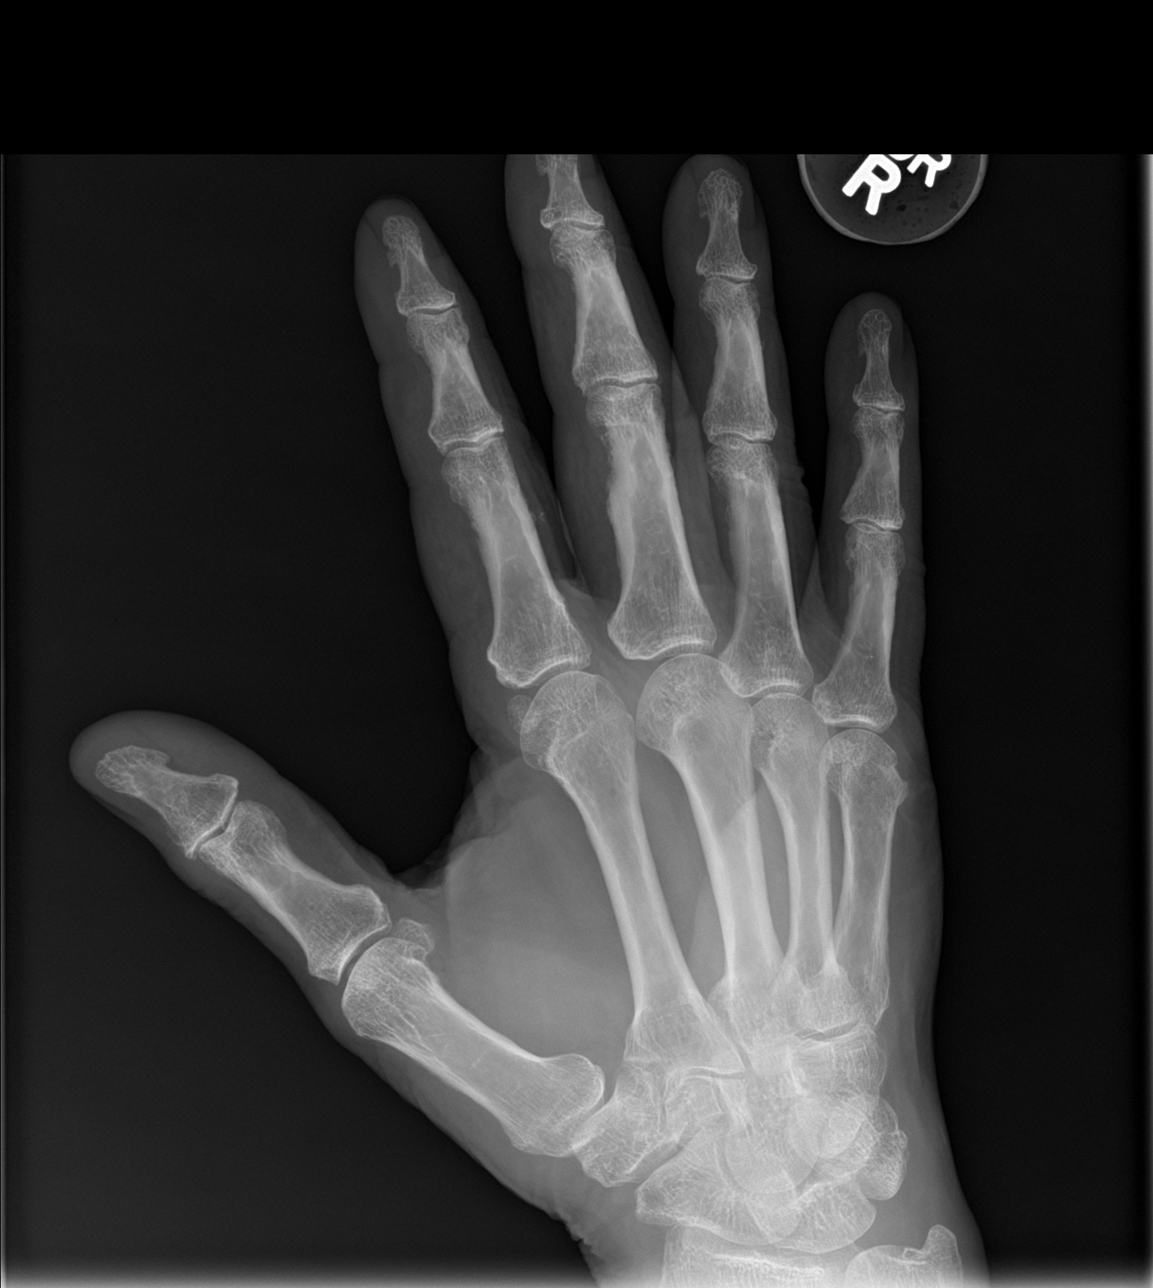

[hand lat]
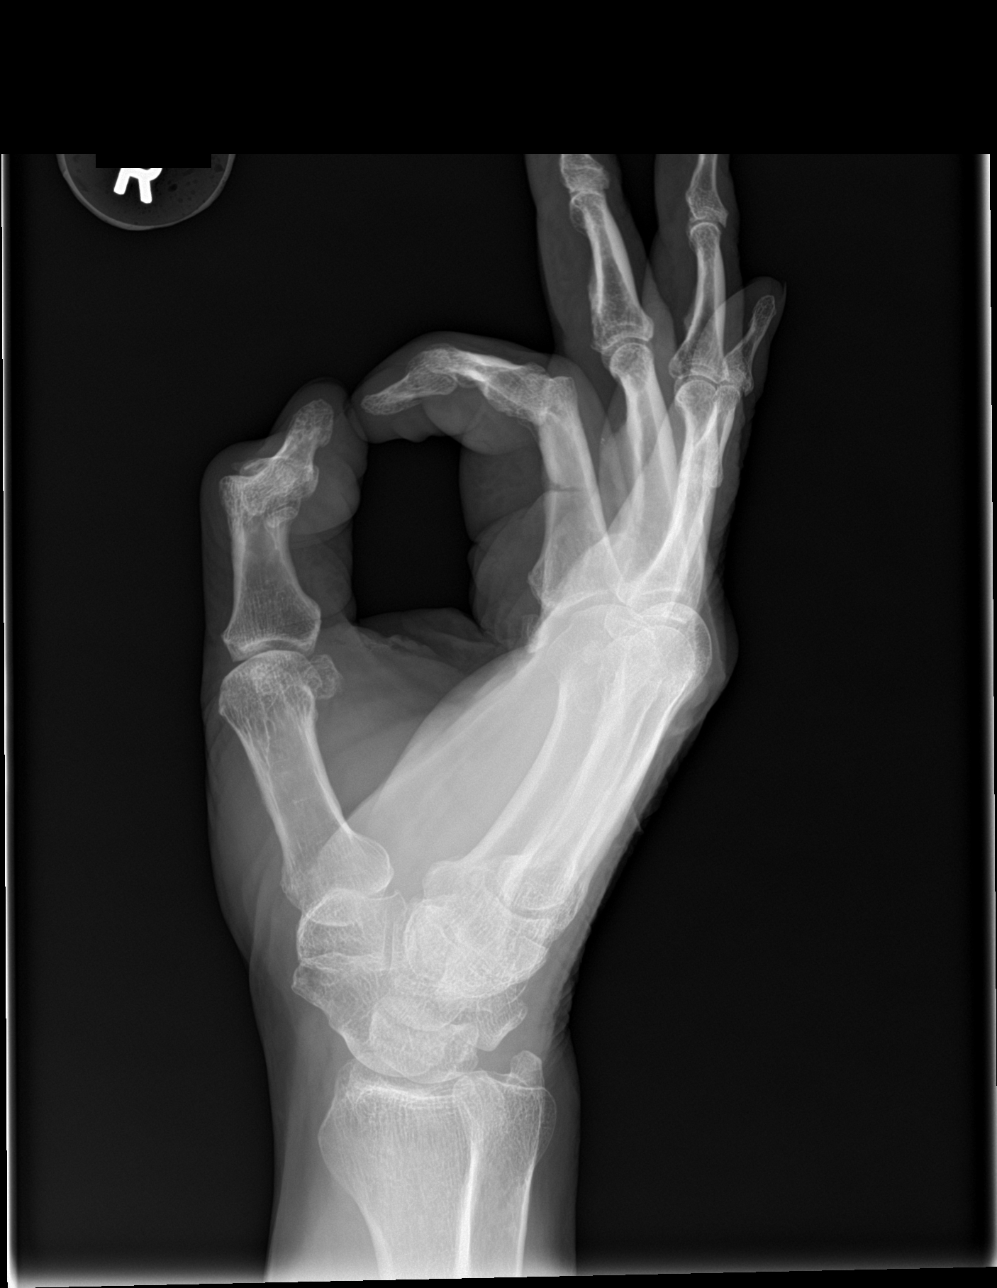

[3 of 3 positions shown; findings below may reference images not displayed]

FINDINGS: No fracture or malalignment. No radiopaque foreign body in the soft
tissues. Mild degenerative changes at the DIP joints.
IMPRESSION: No acute osseous abnormality.  No radiopaque foreign body.

## 2020-08-06 ENCOUNTER — Telehealth: Payer: Self-pay | Admitting: Neurology

## 2020-08-06 NOTE — Telephone Encounter (Signed)
Pt's wife called wanting to know if the pt can be referred to PT. She states the pt sits at home for hours and when he's not sitting he's sleeping. She can not get him to go to the gym and she was thinking PT would help him move around a bit. Please advise.

## 2020-08-06 NOTE — Telephone Encounter (Signed)
I called the patient, left a message.  The lack of physical activity in this patient is a manifestation of abulia which is part of his frontotemporal dementia issue.  I do not think that physical therapy is indicated for this.  It would be nice to have him be more physically active, the best way to handle this will be to get him into a scheduled physical activity program such as [employer] sne[employer] where he would do physical activity and interact with other people.  Ultimately, this is not to change his behavior.

## 2020-08-07 NOTE — Telephone Encounter (Signed)
Pt.'s wife Joseph Lawson called to tell Dr. Jannifer Franklin, that she did get the message, that she appreciates him calling her back & this is what she needed to do.

## 2020-08-19 ENCOUNTER — Ambulatory Visit: Payer: Medicare Other | Admitting: Neurology

## 2020-11-05 ENCOUNTER — Ambulatory Visit: Payer: Medicare Other | Admitting: Neurology

## 2020-11-05 ENCOUNTER — Encounter: Payer: Self-pay | Admitting: Neurology

## 2020-11-05 VITALS — BP 149/72 | HR 94 | Ht 75.0 in | Wt 270.6 lb

## 2020-11-05 DIAGNOSIS — G3109 Other frontotemporal dementia: Secondary | ICD-10-CM

## 2020-11-05 DIAGNOSIS — F0280 Dementia in other diseases classified elsewhere without behavioral disturbance: Secondary | ICD-10-CM

## 2020-11-05 NOTE — Progress Notes (Signed)
Reason for visit: Frontotemporal dementia  Joseph Lawson is an 80 y.o. male  History of present illness:  Joseph Lawson is a 80 year old right-handed white male with a history of behavioral variant frontotemporal dementia.  The patient has had primarily issues with frontal lobe dysfunction associated with abulia.  He has also had issues with fecal and urinary incontinence.  He will go to the bathroom and urinate on the floor, his wife has to clean up after him frequently.  The patient is requiring increasing levels of supervision with bathing and dressing.  The patient has started to let his hygiene slip a bit.  The wife comes in with him today.  She has not yet investigated a memory disorder unit in an extended care facility for him.  The patient was recently in the hospital on 28 September 2020 with what was thought to be a left leg cellulitis, he improved after a several day course of antibiotics.  The patient is eating fairly well.  He remains on Namenda 10 mg twice daily.  He does have diabetes, his blood sugars have been ranging in the upper 100s to the lower 200s.  Past Medical History:  Diagnosis Date  . Arthritis   . Cancer ([place])   . Diabetes mellitus without complication ([place])   . Frontotemporal dementia ([place]) 05/07/2020  . Heart murmur   . Memory disorder 04/01/2017  . 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 UTI (urinary tract infection)   . Wears glasses     Past Surgical History:  Procedure Laterality Date  . BACK SURGERY  1995   lumb lam  . COLONOSCOPY    . DENTAL RESTORATION/EXTRACTION WITH X-RAY    . KNEE ARTHROSCOPY  2012   right  . SALIVARY GLAND SURGERY  1980   right neck  . SHOULDER OPEN ROTATOR CUFF REPAIR Right 09/05/2012   Procedure: RIGHT SHOULDER ROTATOR CUFF REPAIR WITH DISTAL CLAVICLE EXCISION/BICEPS TENOTOMY;  Surgeon: Jolyn Nap, MD;  Location: [facility];  Service: Orthopedics;  Laterality: Right;    Family History  Problem  Relation Age of Onset  . Dementia Mother   . Alzheimer's disease Mother   . Dementia Father     Social history:  reports that he quit smoking about 28 years ago. He has quit using smokeless tobacco. He reports current alcohol use. He reports that he does not use drugs.   No Known Allergies  Medications:  Prior to Admission medications   Medication Sig Start Date End Date Taking? Authorizing Provider  aspirin 81 MG tablet Take 81 mg by mouth daily.   Yes [provider]  Cyanocobalamin (B-12 PO) Take 1 tablet by mouth daily.   Yes [provider]  glipiZIDE (GLUCOTROL) 5 MG tablet Take 5 mg by mouth 2 (two) times daily before a meal. 03/03/18  Yes [provider]  insulin glargine (LANTUS) 100 UNIT/ML injection Inject into the skin daily. 25 units daily   Yes [provider]  losartan (COZAAR) 50 MG tablet Take 50 mg by mouth daily.   Yes [provider]  memantine (NAMENDA) 10 MG tablet Take 1 tablet (10 mg total) by mouth 2 (two) times daily. 02/20/20  Yes Suzzanne Cloud, NP    ROS:  Out of a complete 14 system review of symptoms, the patient complains only of the following symptoms, and all other reviewed systems are negative.  Memory problems Urinary and fecal incontinence  Blood pressure Marland Kitchen)  149/72, pulse 94, height 6\' 3"  (1.905 m), weight 270 lb 9.6 oz (122.7 kg).  Physical Exam  General: The patient is alert and cooperative at the time of the examination.  The patient is moderately obese.  Skin: No significant peripheral edema is noted.   Neurologic Exam  Mental status: The patient is alert and oriented x 3 at the time of the examination. The Mini-Mental status examination done today shows a total score 21/30.   Cranial nerves: Facial symmetry is present. Speech is normal, no aphasia or dysarthria is noted. Extraocular movements are full. Visual fields are full.  Motor: The patient has good strength in all 4  extremities.  Sensory examination: Soft touch sensation is symmetric on the face, arms, and legs.  Coordination: The patient has good finger-nose-finger and heel-to-shin bilaterally.  No significant apraxia with use of extremities is noted.  Gait and station: The patient has a normal gait.   Reflexes: Deep tendon reflexes are symmetric.   Assessment/Plan:  1.  Frontotemporal dementia  I have indicated to the wife that early planning for the future is important.  She will need to investigate an extended care facility for the patient as she is the sole caretaker.  The need for supervision is likely to increase gradually over time.  The patient continues to progress with his memory and with his frontal lobe dysfunction.  He will follow-up here in 6 months.  Jill Alexanders MD 11/05/2020 10:24 AM  [facility] [address] [place],  [address]  Phone [phone] Fax [phone]

## 2021-03-12 ENCOUNTER — Other Ambulatory Visit: Payer: Self-pay | Admitting: Neurology

## 2021-04-15 ENCOUNTER — Telehealth: Payer: Self-pay | Admitting: Neurology

## 2021-04-15 NOTE — Telephone Encounter (Signed)
Pt's wife called states husband was in the ED this past Saturday had a seizure like episode that were 20 minutes apart. Wife wanting him to be seen before December. Requesting a call back.

## 2021-05-15 NOTE — Telephone Encounter (Signed)
Patient was scheduled to see Judson Roch 12/12, but she will not be here. I called and lvm that I moved his appointment to the next day, 12/13, with Dr. Leta Baptist.

## 2021-05-26 ENCOUNTER — Ambulatory Visit: Payer: Medicare Other | Admitting: Neurology

## 2021-05-27 ENCOUNTER — Encounter: Payer: Self-pay | Admitting: Diagnostic Neuroimaging

## 2021-05-27 ENCOUNTER — Other Ambulatory Visit: Payer: Self-pay

## 2021-05-27 ENCOUNTER — Ambulatory Visit: Payer: Medicare Other | Admitting: Diagnostic Neuroimaging

## 2021-05-27 VITALS — BP 142/66 | HR 80 | Ht 75.0 in | Wt 251.0 lb

## 2021-05-27 DIAGNOSIS — G3109 Other frontotemporal dementia: Secondary | ICD-10-CM | POA: Diagnosis not present

## 2021-05-27 DIAGNOSIS — F02818 Dementia in other diseases classified elsewhere, unspecified severity, with other behavioral disturbance: Secondary | ICD-10-CM | POA: Diagnosis not present

## 2021-05-27 MED ORDER — MEMANTINE HCL 10 MG PO TABS: 10.0000 mg | ORAL_TABLET | Freq: Two times a day (BID) | ORAL | 4 refills | Status: DC

## 2021-05-27 NOTE — Progress Notes (Signed)
[facility]  PATIENT: Joseph Lawson DOB: Apr 05, 1941  REFERRING CLINICIAN: [place]* HISTORY FROM: patient  REASON FOR VISIT: follow up   HISTORICAL  CHIEF COMPLAINT:  Chief Complaint  Patient presents with   Follow-up    Rm 6 with wife Arbie Cookey transfer of care from Dr. Jannifer Franklin- follow up on memory.     HISTORY OF PRESENT ILLNESS:   UPDATE (05/27/21, [facility]): Since last visit, had syncope event 04/12/21 (sweating, dazed, limp, pale appearance; no major postictal confusion). Went to ER and admitted 3 days for syncope workup; all negative. Had outpatient cardiology follow up and cardiac monitor.   PRIOR HPI (11/05/20, Dr. Jannifer Franklin): Joseph Lawson is a 80 year old right-handed white male with a history of behavioral variant frontotemporal dementia.  The patient has had primarily issues with frontal lobe dysfunction associated with abulia.  He has also had issues with fecal and urinary incontinence.  He will go to the bathroom and urinate on the floor, his wife has to clean up after him frequently.  The patient is requiring increasing levels of supervision with bathing and dressing.  The patient has started to let his hygiene slip a bit.  The wife comes in with him today.  She has not yet investigated a memory disorder unit in an extended care facility for him.  The patient was recently in the hospital on 28 September 2020 with what was thought to be a left leg cellulitis, he improved after a several day course of antibiotics.  The patient is eating fairly well.  He remains on Namenda 10 mg twice daily.  He does have diabetes, his blood sugars have been ranging in the upper 100s to the lower 200s.   REVIEW OF SYSTEMS: Full 14 system review of systems performed and negative with exception of: as per HPI.   ALLERGIES: Allergies  Allergen Reactions   Lisinopril Cough    HOME MEDICATIONS: Outpatient Medications Prior to Visit  Medication Sig Dispense Refill   aspirin 81 MG  tablet Take 81 mg by mouth daily.     Cyanocobalamin (B-12 PO) Take 1 tablet by mouth daily.     glipiZIDE (GLUCOTROL) 5 MG tablet Take 5 mg by mouth 2 (two) times daily before a meal.  3   insulin glargine (LANTUS) 100 UNIT/ML injection Inject into the skin daily. 25 units daily     memantine (NAMENDA) 10 MG tablet TAKE 1 TABLET BY MOUTH 2 TIMES A DAY 180 tablet 4   losartan (COZAAR) 50 MG tablet Take 50 mg by mouth daily. (Patient not taking: Reported on 05/27/2021)     No facility-administered medications prior to visit.    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   UTI (urinary tract infection)    Wears glasses     PAST SURGICAL HISTORY: Past Surgical History:  Procedure Laterality Date   BACK SURGERY  1995   lumb lam   COLONOSCOPY     DENTAL RESTORATION/EXTRACTION WITH X-RAY     KNEE ARTHROSCOPY  2012   right   SALIVARY GLAND SURGERY  1980   right neck   SHOULDER OPEN ROTATOR CUFF REPAIR Right 09/05/2012   Procedure: RIGHT SHOULDER ROTATOR CUFF REPAIR WITH DISTAL CLAVICLE EXCISION/BICEPS TENOTOMY;  Surgeon: Jolyn Nap, MD;  Location: [facility];  Service: Orthopedics;  Laterality: Right;    FAMILY HISTORY: Family History  Problem Relation Age of Onset   Dementia Mother    Alzheimer's disease Mother    Dementia Father     SOCIAL HISTORY: Social History   Socioeconomic History   Marital status: Married    Spouse name: carol   Number of children: Not on file   Years of education: Not on file   Highest education level: Not on file  Occupational History   Not on file  Tobacco Use   Smoking status: Former    Types: Cigarettes    Quit date: 08/30/1992    Years since quitting: 28.7   Smokeless tobacco: Former   Substance and Sexual Activity   Alcohol use: Yes    Comment: occ   Drug use: No   Sexual activity: Not on file    Comment: still smokes occ cigar  Other Topics Concern   Not on file  Social History Narrative   Lives with wife   Right Handed   Drinks 1-2 cups caffeine daily   Social Determinants of Health   Financial Resource Strain: Not on file  Food Insecurity: Not on file  Transportation Needs: Not on file  Physical Activity: Not on file  Stress: Not on file  Social Connections: Not on file  Intimate Partner Violence: Not on file     PHYSICAL EXAM  GENERAL EXAM/CONSTITUTIONAL: Vitals:  Vitals:   05/27/21 1005  BP: (!) 142/66  Pulse: 80  SpO2: 95%  Weight: 251 lb (113.9 kg)  Height: 6\' 3"  (1.905 m)   Body mass index is 31.37 kg/m. Wt Readings from Last 3 Encounters:  05/27/21 251 lb (113.9 kg)  11/05/20 270 lb 9.6 oz (122.7 kg)  05/07/20 269 lb (122 kg)   Patient is in no distress; well developed, nourished and groomed; neck is supple  CARDIOVASCULAR: Examination of carotid arteries is normal; no carotid bruits Regular rate and rhythm, no murmurs Examination of peripheral vascular system by observation and palpation is normal  EYES: Ophthalmoscopic exam of optic discs and posterior segments is normal; no papilledema or hemorrhages No results found.  MUSCULOSKELETAL: Gait, strength, tone, movements noted in Neurologic exam below  NEUROLOGIC: MENTAL STATUS:  MMSE - Mini Mental State Exam 05/27/2021 11/05/2020 02/20/2020  Not completed: - - -  Orientation to time 0 0 2  Orientation to Place 4 5 4   Registration 3 3 3   Attention/ Calculation 5 4 5   Recall 0 0 2  Language- name 2 objects 2 2 2   Language- repeat 1 1 1   Language- follow 3 step command 3 3 3   Language- follow 3 step command-comments - - -  Language- read & follow direction 1 1 1   Write a sentence 1 1 1   Copy design 0 1 1  Total score 20 21 25     QUIET, WITHDRAWN awake, alert,  oriented to person [facility]  memory  DECR attention and concentration DECR fluent, comprehension intact, naming intact fund of knowledge appropriate  CRANIAL NERVE:  2nd - no papilledema on fundoscopic exam 2nd, 3rd, 4th, 6th - pupils equal and reactive to light, visual fields full to confrontation, extraocular muscles intact, no nystagmus 5th - facial sensation symmetric 7th - facial strength symmetric 8th - hearing intact 9th - palate elevates symmetrically, uvula midline 11th - shoulder shrug symmetric 12th - tongue protrusion midline HOARSE VOICE  MOTOR:  normal bulk  and tone, full strength in the BUE, BLE  SENSORY:  normal and symmetric to light touch, pinprick, temperature, vibration  COORDINATION:  finger-nose-finger, fine finger movements normal  REFLEXES:  deep tendon reflexes TRACE and symmetric  GAIT/STATION:  narrow based gait     DIAGNOSTIC DATA (LABS, IMAGING, TESTING) - I reviewed patient records, labs, notes, testing and imaging myself where available.  Lab Results  Component Value Date   WBC 5.8 03/09/2018   HGB 14.8 03/09/2018   HCT 44.9 03/09/2018   MCV 91 03/09/2018   PLT 230 03/09/2018      Component Value Date/Time   NA 140 03/09/2018 1039   K 5.0 03/09/2018 1039   CL 99 03/09/2018 1039   CO2 22 03/09/2018 1039   GLUCOSE 329 (H) 03/09/2018 1039   BUN 15 03/09/2018 1039   CREATININE 1.00 03/09/2018 1039   CALCIUM 10.0 03/09/2018 1039   PROT 6.3 03/09/2018 1039   ALBUMIN 4.0 03/09/2018 1039   AST 14 03/09/2018 1039   ALT 18 03/09/2018 1039   ALKPHOS 109 03/09/2018 1039   BILITOT 0.5 03/09/2018 1039   GFRNONAA 73 03/09/2018 1039   GFRAA 85 03/09/2018 1039   No results found for: CHOL, HDL, LDLCALC, LDLDIRECT, TRIG, CHOLHDL No results found for: HGBA1C Lab Results  Component Value Date   LTJQZESP23 300 03/09/2018   Lab Results  Component Value Date   TSH 1.850 03/09/2018    02/26/21 MRI brain without contrast  demonstrating: -Moderate generalized atrophy.  Mild chronic small vessel ischemic disease. -No acute findings. Stable compared to MRI on 04/11/17.     ASSESSMENT AND PLAN  80 y.o. year old male here with:   Dx:  1. Behavioral variant frontotemporal dementia ([place])     PLAN:  bvFTD - continue supportive care  - continue memantine  syncope events (summer 2022; Apr 12, 2021) - unclear etiology; continue PCP, cardiology follow up  Meds ordered this encounter  Medications   DISCONTD: memantine (NAMENDA) 10 MG tablet    Sig: Take 1 tablet (10 mg total) by mouth 2 (two) times daily.    Dispense:  180 tablet    Refill:  4   memantine (NAMENDA) 10 MG tablet    Sig: Take 1 tablet (10 mg total) by mouth 2 (two) times daily.    Dispense:  180 tablet    Refill:  4    Return for return to PCP, pending if symptoms worsen or fail to improve.    Penni Bombard, MD 76/22/6333, 54:56 AM Certified in Neurology, Neurophysiology and Neuroimaging  [facility] [address] [place], [place] [address] [phone]

## 2021-07-07 ENCOUNTER — Telehealth: Payer: Self-pay | Admitting: Psychology

## 2021-07-07 NOTE — Telephone Encounter (Signed)
Advised Patient wife Dr.Stewart not in the  office no longer. Please see if Dr.Penumalli write the letter or statement  for the patient. Look like they see the patient.

## 2021-07-07 NOTE — Telephone Encounter (Signed)
Pt Wife advised of Dr.Tat notes, In [place], no one over the age of 57 is required to serve on the Jury.  However, we just did the TEST on this patient I believe and they aren't a patient here.  Will need to talk to [place].  Per wife she thank [place] for the information and getting back to her so fast.  Patient PCP will write the letter for him.

## 2021-07-07 NOTE — Telephone Encounter (Signed)
Patients wife called stating Joseph Lawson has been called for [employer] Aug 04, 2021.  She needs a letter showing his Frontotemporal dementia [facility]) diagnosis, that is signed by a Dr.  He was seen by Dr Nicole Kindred in 2021.

## 2022-06-02 ENCOUNTER — Other Ambulatory Visit: Payer: Self-pay | Admitting: Diagnostic Neuroimaging

## 2022-06-24 ENCOUNTER — Other Ambulatory Visit: Payer: Self-pay | Admitting: Diagnostic Neuroimaging

## 2022-09-24 ENCOUNTER — Other Ambulatory Visit: Payer: Self-pay | Admitting: Diagnostic Neuroimaging

## 2022-11-14 DEATH — deceased
